# Patient Record
Sex: Female | Born: 1955
Health system: Southern US, Community
[De-identification: ages and names within clinical notes are randomized; demographics above are authoritative.]

## PROBLEM LIST (undated history)

## (undated) DIAGNOSIS — F419 Anxiety disorder, unspecified: Secondary | ICD-10-CM

## (undated) DIAGNOSIS — Z800 Family history of malignant neoplasm of digestive organs: Secondary | ICD-10-CM

## (undated) DIAGNOSIS — K5909 Other constipation: Secondary | ICD-10-CM

## (undated) DIAGNOSIS — E039 Hypothyroidism, unspecified: Secondary | ICD-10-CM

## (undated) DIAGNOSIS — K219 Gastro-esophageal reflux disease without esophagitis: Secondary | ICD-10-CM

## (undated) DIAGNOSIS — E785 Hyperlipidemia, unspecified: Secondary | ICD-10-CM

## (undated) HISTORY — DX: Family history of malignant neoplasm of digestive organs: Z80.0

## (undated) HISTORY — PX: LASIK: SHX215

## (undated) HISTORY — PX: WRIST GANGLION EXCISION: SUR520

## (undated) HISTORY — PX: OTHER SURGICAL HISTORY: SHX169

## (undated) HISTORY — PX: COLONOSCOPY: SHX174

## (undated) HISTORY — DX: Other constipation: K59.09

## (undated) HISTORY — DX: Hyperlipidemia, unspecified: E78.5

---

## 2001-09-05 ENCOUNTER — Ambulatory Visit (HOSPITAL_COMMUNITY): Admission: RE | Admit: 2001-09-05 | Discharge: 2001-09-05 | Payer: Self-pay | Admitting: Pulmonary Disease

## 2005-06-30 ENCOUNTER — Ambulatory Visit (HOSPITAL_BASED_OUTPATIENT_CLINIC_OR_DEPARTMENT_OTHER): Admission: RE | Admit: 2005-06-30 | Discharge: 2005-06-30 | Payer: Self-pay | Admitting: Orthopedic Surgery

## 2010-03-22 ENCOUNTER — Encounter: Payer: Self-pay | Admitting: Pulmonary Disease

## 2010-06-10 ENCOUNTER — Ambulatory Visit (INDEPENDENT_AMBULATORY_CARE_PROVIDER_SITE_OTHER): Payer: 59

## 2010-06-10 ENCOUNTER — Inpatient Hospital Stay (INDEPENDENT_AMBULATORY_CARE_PROVIDER_SITE_OTHER)
Admission: RE | Admit: 2010-06-10 | Discharge: 2010-06-10 | Disposition: A | Discharge status: Home / Self Care | Payer: 59 | Source: Ambulatory Visit | Attending: Emergency Medicine | Admitting: Emergency Medicine

## 2010-06-10 DIAGNOSIS — R609 Edema, unspecified: Secondary | ICD-10-CM

## 2010-06-10 LAB — POCT I-STAT, CHEM 8
BUN: 10 mg/dL (ref 6–23)
Calcium, Ion: 1.27 mmol/L (ref 1.12–1.32)
Chloride: 103 mEq/L (ref 96–112)
Creatinine, Ser: 0.6 mg/dL (ref 0.4–1.2)
HCT: 38 % (ref 36.0–46.0)
Hemoglobin: 12.9 g/dL (ref 12.0–15.0)
Potassium: 4.4 mEq/L (ref 3.5–5.1)
Sodium: 140 mEq/L (ref 135–145)
TCO2: 25 mmol/L (ref 0–100)

## 2010-06-16 LAB — HEPATIC FUNCTION PANEL
AST: 23 U/L (ref 13–35)
Bilirubin, Total: 0.5 mg/dL

## 2010-06-16 LAB — CBC AND DIFFERENTIAL: Hemoglobin: 11.8 g/dL — AB (ref 12.0–16.0)

## 2010-06-16 LAB — BASIC METABOLIC PANEL: Potassium: 4.5 mmol/L (ref 3.4–5.3)

## 2010-06-19 ENCOUNTER — Other Ambulatory Visit (HOSPITAL_COMMUNITY): Payer: Self-pay | Admitting: "Endocrinology

## 2010-06-19 DIAGNOSIS — E0590 Thyrotoxicosis, unspecified without thyrotoxic crisis or storm: Secondary | ICD-10-CM

## 2010-06-22 ENCOUNTER — Encounter (HOSPITAL_COMMUNITY): Payer: Self-pay

## 2010-06-22 ENCOUNTER — Encounter (HOSPITAL_COMMUNITY)
Admission: RE | Admit: 2010-06-22 | Discharge: 2010-06-22 | Disposition: A | Discharge status: Home / Self Care | Payer: 59 | Source: Ambulatory Visit | Attending: "Endocrinology | Admitting: "Endocrinology

## 2010-06-22 DIAGNOSIS — E0590 Thyrotoxicosis, unspecified without thyrotoxic crisis or storm: Secondary | ICD-10-CM | POA: Insufficient documentation

## 2010-06-22 MED ORDER — SODIUM IODIDE I 131 CAPSULE
10.0000 | Freq: Once | INTRAVENOUS | Status: AC | PRN
  Administered 2010-06-22: 10 via ORAL

## 2010-06-23 ENCOUNTER — Encounter (HOSPITAL_COMMUNITY): Payer: 59 | Attending: "Endocrinology

## 2010-06-23 MED ORDER — SODIUM PERTECHNETATE TC 99M INJECTION
10.0000 | Freq: Once | INTRAVENOUS | Status: AC | PRN
  Administered 2010-06-23: 10 via INTRAVENOUS

## 2010-06-25 ENCOUNTER — Other Ambulatory Visit (HOSPITAL_COMMUNITY): Payer: Self-pay | Admitting: "Endocrinology

## 2010-06-25 DIAGNOSIS — E0500 Thyrotoxicosis with diffuse goiter without thyrotoxic crisis or storm: Secondary | ICD-10-CM

## 2010-06-26 ENCOUNTER — Ambulatory Visit (HOSPITAL_COMMUNITY)
Admission: RE | Admit: 2010-06-26 | Discharge: 2010-06-26 | Disposition: A | Discharge status: Home / Self Care | Payer: 59 | Source: Ambulatory Visit | Attending: "Endocrinology | Admitting: "Endocrinology

## 2010-06-26 DIAGNOSIS — E0500 Thyrotoxicosis with diffuse goiter without thyrotoxic crisis or storm: Secondary | ICD-10-CM

## 2010-06-26 MED ORDER — SODIUM IODIDE I 131 CAPSULE
12.0000 | Freq: Once | INTRAVENOUS | Status: AC | PRN
  Administered 2010-06-26: 12 via ORAL

## 2010-10-08 ENCOUNTER — Encounter (INDEPENDENT_AMBULATORY_CARE_PROVIDER_SITE_OTHER): Payer: Self-pay

## 2010-10-19 ENCOUNTER — Ambulatory Visit (INDEPENDENT_AMBULATORY_CARE_PROVIDER_SITE_OTHER): Payer: 59 | Admitting: Internal Medicine

## 2010-11-17 ENCOUNTER — Ambulatory Visit (INDEPENDENT_AMBULATORY_CARE_PROVIDER_SITE_OTHER): Payer: 59 | Admitting: Internal Medicine

## 2011-01-18 ENCOUNTER — Encounter (INDEPENDENT_AMBULATORY_CARE_PROVIDER_SITE_OTHER): Payer: Self-pay | Admitting: Internal Medicine

## 2011-01-18 ENCOUNTER — Telehealth (INDEPENDENT_AMBULATORY_CARE_PROVIDER_SITE_OTHER): Payer: Self-pay | Admitting: *Deleted

## 2011-01-18 ENCOUNTER — Ambulatory Visit (INDEPENDENT_AMBULATORY_CARE_PROVIDER_SITE_OTHER): Payer: 59 | Admitting: Internal Medicine

## 2011-01-18 ENCOUNTER — Other Ambulatory Visit (INDEPENDENT_AMBULATORY_CARE_PROVIDER_SITE_OTHER): Payer: Self-pay | Admitting: *Deleted

## 2011-01-18 ENCOUNTER — Encounter (INDEPENDENT_AMBULATORY_CARE_PROVIDER_SITE_OTHER): Payer: Self-pay | Admitting: *Deleted

## 2011-01-18 VITALS — BP 110/74 | HR 72 | Temp 99.8°F | Resp 14 | Ht 73.0 in | Wt 190.7 lb

## 2011-01-18 DIAGNOSIS — R198 Other specified symptoms and signs involving the digestive system and abdomen: Secondary | ICD-10-CM

## 2011-01-18 DIAGNOSIS — R194 Change in bowel habit: Secondary | ICD-10-CM

## 2011-01-18 DIAGNOSIS — Z8489 Family history of other specified conditions: Secondary | ICD-10-CM

## 2011-01-18 DIAGNOSIS — Z800 Family history of malignant neoplasm of digestive organs: Secondary | ICD-10-CM

## 2011-01-18 NOTE — Consult Note (Signed)
NAME:  Elizabeth Lopez, Elizabeth Lopez              ACCOUNT NO.:  [id number]  MEDICAL RECORD NO.:  [id number]  LOCATION:                                 FACILITY:  PHYSICIAN:  Lionel December, M.D.    DATE OF BIRTH:  08/25/1955  DATE OF CONSULTATION:  01/18/2011 DATE OF DISCHARGE:                                CONSULTATION   REASON FOR CONSULTATION:  Change in bowel habits.  Family history of colon carcinoma in a first-degree relatives (mother).  HISTORY OF PRESENT ILLNESS:  Elizabeth Lopez is a 55 year old African female who is referred through courtesy of Dr.  Juanetta Gosling for GI evaluation.  She was in usual state of health until March this year when she developed pretibial edema.  She was evaluated by Dr. Juanetta Gosling and diagnosed with thyrotoxicosis.  She was treated with radioiodine under supervision of Dr. Fransico Him and now she is on Synthroid.  The patient recalls that she began to have bowel problems sometime after she received ablative therapy with radioiodine.  Her average has been 1- 2 bowel movements per day for years.  She began to pass hard stools and round balls.  She was prescribed MiraLax by Dr. Juanetta Gosling.  She took 2 doses and her bowels returned to normal.  However, for the last 2 months, she has noted getting constipated and passing fecal bowels in small pieces.  The patient's last colonoscopy was 4 years ago and because of family history, Dr. Juanetta Gosling felt that she is to be re- evaluated.  The patient states her Synthroid dose was adjusted about 2 months ago and she has an appointment with Dr. Fransico Him in a month.  She denies abdominal pain, melena or rectal bleeding.  When she was given radioiodine, she gained 8 pounds and now she is back to her baseline. She denies heartburn, nausea or vomiting.  She states she is very active.  She walks few miles 3-4 times each week.  She is on a high- fiber diet.  REVIEW OF SYSTEMS:  Positive for menstrual cramps.  She takes hydrocodone on day 1 and for the next  couple of days, she may take ibuprofen, but this is only for 2 or 3 days.  CURRENT MEDICATIONS: 1. Vitamin D 2000 units p.o. daily. 2. Hydrocodone 2.5/500 one tablet at the onset of period. 3. Ibuprofen 400 mg p.o. q.6 p.r.n. for 2-3 days each month for     menstrual cramps. 4. Levothyroxine 137 mcg p.o. daily. 5. Loestrin FE p.o. daily. 6. Vitamins for hair 1 p.o. b.i.d. 7. B12 500 mcg p.o. daily. 8. Vitamin C 500 mg p.o. daily.  PAST MEDICAL HISTORY:  She was diagnosed with thyrotoxicosis in March this year and underwent ablative therapy in April and now she is on replacement therapy.  She was diagnosed with vitamin D deficiency in April 2012.  Initially, she was treated with high-dose and now on maintenance.  She had ganglion cyst excised from her right wrist about 9 years ago.  She is felt to be perimenopausal.  The patient's last colonoscopy was in November 2008, revealing external hemorrhoids, otherwise normal examination.  ALLERGIES:  NK.  FAMILY HISTORY:  Mother was diagnosed with colorectal carcinoma at age  58 and she developed liver mets 13 years later and died at age 32. Father was diagnosed with stomach cancer at age 19 and died at 27.  She has 3 brothers and 3 sisters in good health.  She has first cousin as well as maternal aunt, would also treated for thyrotoxicosis or Graves disease.  SOCIAL HISTORY:  She is married.  She does not have any children.  She has never smoked cigarettes.  She drinks alcohol occasionally.  She works as a Pensions consultant at [employer].  PHYSICAL EXAMINATION:  VITAL SIGNS:  Weight 190.7 pounds, she is 73 inches tall, pulse 72 per minute, blood pressure 110/74, respirations 14 and temp is 99.8. HEENT:  Conjunctivae are pink.  Sclerae are nonicteric.  Oropharyngeal mucosa is normal. NECK:  No neck masses or thyromegaly noted. CARDIAC:  With regular rhythm.  Normal S1 and S2.  No murmur or gallop noted. LUNGS:  Clear to  auscultation. ABDOMEN:  Symmetrical.  Bowel sounds are normal on palpation, soft abdomen without tenderness, organomegaly, or masses. RECTAL:  Deferred as she had 1 at Dr. Juanetta Gosling office revealing guaiac negative stool. EXTREMITIES:  No peripheral edema or clubbing noted.  ASSESSMENT:  Elizabeth Lopez is a 55 year old African American female who has developed change in her bowel habits with tendency to constipation.  Her bowel habits have changed while she was diagnosed with thyrotoxicosis treated with radioiodine and now on a maintenance with levothyroxine.  I suspect these changes are due to her thyroid disease.  Family history is significant for colorectal carcinoma in her mother at age 37.  The patient's last colonoscopy was 4 years ago.  Given her symptoms, we would recommend proceeding with colonoscopy now rather than waiting for another year.  RECOMMENDATIONS: 1. The patient advised to continue high-fiber diet and can take     MiraLax on as needed basis. 2. We will schedule her for diagnostic colonoscopy in near future.  Procedure and risks are reviewed with the patient and she is agreeable.  We appreciate the opportunity to participate in the care of this nice lady.          ______________________________ Lionel December, M.D.     NR/MEDQ  D:  01/18/2011  T:  01/18/2011  Job:  [id number]  cc:   Ramon Dredge L. Juanetta Gosling, M.D. Fax: [phone]

## 2011-01-18 NOTE — Telephone Encounter (Signed)
Patient needs movi prep 

## 2011-01-18 NOTE — Patient Instructions (Signed)
Continue high-fiber diet and can take MiraLAX on an as-needed basis. Colonoscopy to be scheduled in near future.

## 2011-01-19 MED ORDER — PEG-KCL-NACL-NASULF-NA ASC-C 100 G PO SOLR: 1.0000 | Freq: Once | ORAL | Status: DC

## 2011-01-19 NOTE — Progress Notes (Signed)
CONSULTATION  REASON FOR CONSULTATION: Change in bowel habits. Family history of  colon carcinoma in a first-degree relatives (mother).  HISTORY OF PRESENT ILLNESS: Roxana is a 55 year old African female who  is referred through courtesy of Dr. Juanetta Gosling for GI evaluation. She was  in usual state of health until March this year when she developed  pretibial edema. She was evaluated by Dr. Juanetta Gosling and diagnosed with  thyrotoxicosis. She was treated with radioiodine under supervision of  Dr. Fransico Him and now she is on Synthroid.  The patient recalls that she began to have bowel problems sometime after  she received ablative therapy with radioiodine. Her average has been 1-  2 bowel movements per day for years. She began to pass hard stools and  round balls. She was prescribed MiraLax by Dr. Juanetta Gosling. She took 2  doses and her bowels returned to normal. However, for the last 2  months, she has noted getting constipated and passing fecal bowels in  small pieces. The patient's last colonoscopy was 4 years ago and  because of family history, Dr. Juanetta Gosling felt that she is to be re-  evaluated. The patient states her Synthroid dose was adjusted about 2  months ago and she has an appointment with Dr. Fransico Him in a month. She  denies abdominal pain, melena or rectal bleeding. When she was given  radioiodine, she gained 8 pounds and now she is back to her baseline.  She denies heartburn, nausea or vomiting. She states she is very  active. She walks few miles 3-4 times each week. She is on a high-  fiber diet.  REVIEW OF SYSTEMS: Positive for menstrual cramps. She takes  hydrocodone on day 1 and for the next couple of days, she may take  ibuprofen, but this is only for 2 or 3 days.  CURRENT MEDICATIONS:  1. Vitamin D 2000 units p.o. daily.  2. Hydrocodone 2.5/500 one tablet at the onset of period.  3. Ibuprofen 400 mg p.o. q.6 p.r.n. for 2-3 days each month for  menstrual cramps.  4. Levothyroxine 137 mcg  p.o. daily.  5. Loestrin FE p.o. daily.  6. Vitamins for hair 1 p.o. b.i.d.  7. B12 500 mcg p.o. daily.  8. Vitamin C 500 mg p.o. daily.  PAST MEDICAL HISTORY: She was diagnosed with thyrotoxicosis in March  this year and underwent ablative therapy in April and now she is on  replacement therapy. She was diagnosed with vitamin D deficiency in  April 2012. Initially, she was treated with high-dose and now on  maintenance. She had ganglion cyst excised from her right wrist about 9  years ago. She is felt to be perimenopausal.  The patient's last colonoscopy was in November 2008, revealing external  hemorrhoids, otherwise normal examination.  ALLERGIES: NK.  FAMILY HISTORY: Mother was diagnosed with colorectal carcinoma at age  45 and she developed liver mets 13 years later and died at age 69.  Father was diagnosed with stomach cancer at age 10 and died at 65. She  has 3 brothers and 3 sisters in good health. She has first cousin as  well as maternal aunt, would also treated for thyrotoxicosis or Graves  disease.  SOCIAL HISTORY: She is married. She does not have any children. She  has never smoked cigarettes. She drinks alcohol occasionally. She  works as a Pensions consultant at [employer].  PHYSICAL EXAMINATION: VITAL SIGNS: Weight 190.7 pounds, she is 73  inches tall, pulse 72 per minute, blood pressure 110/74,  respirations 14  and temp is 99.8.  HEENT: Conjunctivae are pink. Sclerae are nonicteric. Oropharyngeal  mucosa is normal.  NECK: No neck masses or thyromegaly noted.  CARDIAC: With regular rhythm. Normal S1 and S2. No murmur or gallop  noted.  LUNGS: Clear to auscultation.  ABDOMEN: Symmetrical. Bowel sounds are normal on palpation, soft  abdomen without tenderness, organomegaly, or masses.  RECTAL: Deferred as she had 1 at Dr. Juanetta Gosling office revealing guaiac  negative stool.  EXTREMITIES: No peripheral edema or clubbing noted.  ASSESSMENT: Tziporah is a 55 year old African  American female who has  developed change in her bowel habits with tendency to constipation. Her  bowel habits have changed while she was diagnosed with thyrotoxicosis  treated with radioiodine and now on a maintenance with levothyroxine. I  suspect these changes are due to her thyroid disease. Family history is  significant for colorectal carcinoma in her mother at age 40. The  patient's last colonoscopy was 4 years ago. Given her symptoms, I would recommend proceeding with colonoscopy now rather than waiting for  another year.  RECOMMENDATIONS:  1. The patient advised to continue high-fiber diet and can take  MiraLax on as needed basis.  2. We will schedule her for diagnostic colonoscopy in near future.  Procedure and risks  reviewed with the patient and she is agreeable.  We appreciate the opportunity to participate in the care of this nice  lady.

## 2011-02-11 MED ORDER — SODIUM CHLORIDE 0.45 % IV SOLN
Freq: Once | INTRAVENOUS | Status: AC
  Administered 2011-02-12: 20 mL/h via INTRAVENOUS

## 2011-02-12 ENCOUNTER — Encounter (HOSPITAL_COMMUNITY)
Admission: RE | Disposition: A | Discharge status: Home / Self Care | Payer: Self-pay | Source: Ambulatory Visit | Attending: Internal Medicine

## 2011-02-12 ENCOUNTER — Encounter (HOSPITAL_COMMUNITY): Payer: Self-pay | Admitting: *Deleted

## 2011-02-12 ENCOUNTER — Other Ambulatory Visit (INDEPENDENT_AMBULATORY_CARE_PROVIDER_SITE_OTHER): Payer: Self-pay | Admitting: Internal Medicine

## 2011-02-12 ENCOUNTER — Ambulatory Visit (HOSPITAL_COMMUNITY)
Admission: RE | Admit: 2011-02-12 | Discharge: 2011-02-12 | Disposition: A | Discharge status: Home / Self Care | Payer: 59 | Source: Ambulatory Visit | Attending: Internal Medicine | Admitting: Internal Medicine

## 2011-02-12 DIAGNOSIS — R194 Change in bowel habit: Secondary | ICD-10-CM

## 2011-02-12 DIAGNOSIS — Z800 Family history of malignant neoplasm of digestive organs: Secondary | ICD-10-CM | POA: Insufficient documentation

## 2011-02-12 DIAGNOSIS — K5909 Other constipation: Secondary | ICD-10-CM | POA: Insufficient documentation

## 2011-02-12 DIAGNOSIS — K449 Diaphragmatic hernia without obstruction or gangrene: Secondary | ICD-10-CM

## 2011-02-12 DIAGNOSIS — K644 Residual hemorrhoidal skin tags: Secondary | ICD-10-CM | POA: Insufficient documentation

## 2011-02-12 DIAGNOSIS — D126 Benign neoplasm of colon, unspecified: Secondary | ICD-10-CM

## 2011-02-12 DIAGNOSIS — R198 Other specified symptoms and signs involving the digestive system and abdomen: Secondary | ICD-10-CM

## 2011-02-12 HISTORY — PX: COLONOSCOPY: SHX5424

## 2011-02-12 SURGERY — COLONOSCOPY
Anesthesia: Moderate Sedation

## 2011-02-12 MED ORDER — MEPERIDINE HCL 50 MG/ML IJ SOLN
INTRAMUSCULAR | Status: AC
  Filled 2011-02-12: qty 1

## 2011-02-12 MED ORDER — MIDAZOLAM HCL 5 MG/5ML IJ SOLN
INTRAMUSCULAR | Status: AC
  Filled 2011-02-12: qty 10

## 2011-02-12 MED ORDER — MIDAZOLAM HCL 5 MG/5ML IJ SOLN
INTRAMUSCULAR | Status: DC | PRN
  Administered 2011-02-12 (×5): 2 mg via INTRAVENOUS

## 2011-02-12 MED ORDER — MEPERIDINE HCL 50 MG/ML IJ SOLN
INTRAMUSCULAR | Status: DC | PRN
  Administered 2011-02-12 (×2): 25 mg via INTRAVENOUS

## 2011-02-12 MED ORDER — STERILE WATER FOR IRRIGATION IR SOLN
Status: DC | PRN
  Administered 2011-02-12: 09:00:00

## 2011-02-12 NOTE — Op Note (Signed)
COLONOSCOPY PROCEDURE REPORT  PATIENT:  Elizabeth Lopez  MR#:  [id number] Birthdate:  01/22/1956, 55 y.o., female Endoscopist:  Dr. Malissa Hippo, MD Referred By:  Dr. Oneal Deputy. Juanetta Gosling, MD Procedure Date: 02/12/2011  Procedure:   Colonoscopy  Indications:  Patient is 55 year old African female who has noted change in her bowel habits with progressive constipation . Patient's loss colonoscopy was in November 2008. Family history is positive for colon carcinoma in her mother age 43 and she died of liver metastases 13 years later. Patient is undergoing colonoscopy primarily for diagnostic indication.  Informed Consent:  Procedure and risks were reviewed with the patient and informed consent was obtained  Medications:  Demerol 50 mg IV Versed 10 mg IV  Description of procedure:  After a digital rectal exam was performed, that colonoscope was advanced from the anus through the rectum and colon to the area of the cecum, ileocecal valve and appendiceal orifice. The cecum was deeply intubated. These structures were well-seen and photographed for the record. From the level of the cecum and ileocecal valve, the scope was slowly and cautiously withdrawn. The mucosal surfaces were carefully surveyed utilizing scope tip to flexion to facilitate fold flattening as needed. The scope was pulled down into the rectum where a thorough exam including retroflexion was performed.  Findings:   Prep excellent. Tortuous colon with two 3 mm polyps at sigmoid colon. These were ablated via cold biopsy and submitted in one container. Rectal mucosa was normal. Small hemorrhoids noted below the dentate line.  Therapeutic/Diagnostic Maneuvers Performed:  See above  Complications:  None  Cecal Withdrawal Time:  10 minutes  Impression:  Examination performed to cecum. Two small polyps ablated via cold cold biopsy from sigmoid colon and submitted in one container. Small external hemorrhoids.   Recommendations:    Standard instructions given. She will continue high-fiber diet, fiber supplements and when necessary MiraLAX. I will be contacting patient with results of biopsy. She should return for next colonoscopy in 5 years  Skyler Dusing U  02/12/2011 10:14 AM  CC: Dr. Fredirick Maudlin, MD & Dr. Bonnetta Barry ref. provider found

## 2011-02-12 NOTE — H&P (Signed)
This is an update to history and physical from 01/18/2011. Patient is not taking MiraLAX as recommended. She is agreeable to proceed with colonoscopy.

## 2011-02-26 ENCOUNTER — Encounter (INDEPENDENT_AMBULATORY_CARE_PROVIDER_SITE_OTHER): Payer: Self-pay | Admitting: *Deleted

## 2011-03-01 ENCOUNTER — Encounter (HOSPITAL_COMMUNITY): Payer: Self-pay | Admitting: Internal Medicine

## 2011-06-29 ENCOUNTER — Encounter (INDEPENDENT_AMBULATORY_CARE_PROVIDER_SITE_OTHER): Payer: Self-pay

## 2013-09-13 ENCOUNTER — Encounter (INDEPENDENT_AMBULATORY_CARE_PROVIDER_SITE_OTHER): Payer: Self-pay | Admitting: *Deleted

## 2013-09-20 ENCOUNTER — Other Ambulatory Visit (INDEPENDENT_AMBULATORY_CARE_PROVIDER_SITE_OTHER): Payer: Self-pay | Admitting: *Deleted

## 2013-09-20 ENCOUNTER — Encounter (INDEPENDENT_AMBULATORY_CARE_PROVIDER_SITE_OTHER): Payer: Self-pay | Admitting: *Deleted

## 2013-09-20 ENCOUNTER — Ambulatory Visit (INDEPENDENT_AMBULATORY_CARE_PROVIDER_SITE_OTHER): Payer: 59 | Admitting: Internal Medicine

## 2013-09-20 ENCOUNTER — Encounter (INDEPENDENT_AMBULATORY_CARE_PROVIDER_SITE_OTHER): Payer: Self-pay | Admitting: Internal Medicine

## 2013-09-20 VITALS — BP 108/70 | HR 76 | Temp 98.1°F | Ht 72.0 in | Wt 189.1 lb

## 2013-09-20 DIAGNOSIS — E039 Hypothyroidism, unspecified: Secondary | ICD-10-CM

## 2013-09-20 DIAGNOSIS — K219 Gastro-esophageal reflux disease without esophagitis: Secondary | ICD-10-CM

## 2013-09-20 DIAGNOSIS — E890 Postprocedural hypothyroidism: Secondary | ICD-10-CM | POA: Insufficient documentation

## 2013-09-20 DIAGNOSIS — E7800 Pure hypercholesterolemia, unspecified: Secondary | ICD-10-CM

## 2013-09-20 MED ORDER — OMEPRAZOLE 40 MG PO CPDR: 40.0000 mg | DELAYED_RELEASE_CAPSULE | Freq: Every day | ORAL | Status: DC

## 2013-09-20 NOTE — Patient Instructions (Signed)
Fleets enema (soap suds). Hold for 30-60 minutes.  May repeat x 1 . EGD.The risks and benefits such as perforation, bleeding, and infection were reviewed with the patient and is agreeable.

## 2013-09-20 NOTE — Progress Notes (Signed)
Subjective:     Patient ID: Elizabeth Lopez, female   DOB: [birth year], 58 y.o.   MRN: [id number]  HPI  Saw Dr. Dorris Fetch the first of the year.  When she ate she felt like her food was coming back up in her esophagus.  She was started on Omeprazole and this helped her symptoms. She was given a 2 months supply. She tells me today that since she has been off the Omperazole. she has  acid reflux and food feels like it is coming back up in her esophagus. She tells me she has a burning in her stomach. She says she felt bad and she really did not have an appetite. A friend gave her Nexium OTC and her symptoms improved. She tells me the Nexium caused her to become constipated. She has not had a BM since Sunday.  She took a laxative yesterday. She has drank water, ate turnip greens yesterday without results. Family hx of stomach cancer (Fatjher had stomach cancer age 66) She usually has a BM daily. Reports no melena or BRRB. Appetite has remaind good.  Appetite is good. There has been no weight loss.  02/12/2011 Colonoscopy: Family hx of colon cancer:  Impression:  Examination performed to cecum.  Two small polyps ablated via cold cold biopsy from sigmoid colon and submitted in one container.  Small external hemorrhoids.  Biopsy: Hyperplastic polyp    Review of Systems Past Medical History  Diagnosis Date  . Chronic constipation   . Hyperthyroidism   . Hyperlipidemia   . Family hx of colon cancer     Past Surgical History  Procedure Laterality Date  . Knot      patient had a knot removed from right wrisit 9 years ago.  . Colonoscopy    . Lasik    . Wrist ganglion excision      right  . Colonoscopy  02/12/2011    Procedure: COLONOSCOPY;  Surgeon: Rogene Houston, MD;  Location: [facility];  Service: Endoscopy;  Laterality: N/A;  9:45    No Known Allergies  Current Outpatient Prescriptions on File Prior to Visit  Medication Sig Dispense Refill  . Cholecalciferol (VITAMIN D PO) Take  1,000 mg by mouth daily.        Marland Kitchen ibuprofen (ADVIL,MOTRIN) 200 MG tablet Take 400 mg by mouth every 6 (six) hours as needed. For menstrual cramps; no more 2 to 3 days each month.       . levothyroxine (SYNTHROID, LEVOTHROID) 137 MCG tablet Take 175 mcg by mouth daily.       Cyndie Chime Estrad-Fe Biphas (LO LOESTRIN FE PO) Take by mouth.        . vitamin B-12 (CYANOCOBALAMIN) 500 MCG tablet Take 500 mcg by mouth daily.        . vitamin C (ASCORBIC ACID) 500 MG tablet Take 500 mg by mouth daily.         No current facility-administered medications on file prior to visit.        Objective:   Physical Exam  Filed Vitals:   09/20/13 1547  BP: 108/70  Pulse: 76  Temp: 98.1 F (36.7 C)  Height: 6' (1.829 m)  Weight: 189 lb 1.6 oz (85.775 kg)    Alert and oriented. Skin warm and dry. Oral mucosa is moist.   . Sclera anicteric, conjunctivae is pink. Thyroid not enlarged. No cervical lymphadenopathy. Lungs clear. Heart regular rate and rhythm.  Abdomen is soft. Bowel sounds are positive. No  hepatomegaly. No abdominal masses felt. No tenderness.  No edema to lower extremities.   Rectal exam did not reveal an impaction.     Assessment:    GERD. Not controlled at this time. Will restart her on Omeprazole. PUD needs to be ruled out.   Constipation. Symptoms started after taking Nexium which she has stopped. Plan:    EGD.The risks and benefits such as perforation, bleeding, and infection were reviewed with the patient and is agreeable.   Fleets enema today. Try to hold for at least 30 minutes. If no results: repeat. Call me tomorrow.

## 2013-09-28 ENCOUNTER — Encounter (HOSPITAL_COMMUNITY): Payer: Self-pay | Admitting: Pharmacy Technician

## 2013-10-05 ENCOUNTER — Encounter (HOSPITAL_COMMUNITY): Payer: Self-pay | Admitting: *Deleted

## 2013-10-05 ENCOUNTER — Encounter (HOSPITAL_COMMUNITY)
Admission: RE | Disposition: A | Discharge status: Home / Self Care | Payer: Self-pay | Source: Ambulatory Visit | Attending: Internal Medicine

## 2013-10-05 ENCOUNTER — Ambulatory Visit (HOSPITAL_COMMUNITY)
Admission: RE | Admit: 2013-10-05 | Discharge: 2013-10-05 | Disposition: A | Discharge status: Home / Self Care | Payer: 59 | Source: Ambulatory Visit | Attending: Internal Medicine | Admitting: Internal Medicine

## 2013-10-05 DIAGNOSIS — Z800 Family history of malignant neoplasm of digestive organs: Secondary | ICD-10-CM | POA: Diagnosis not present

## 2013-10-05 DIAGNOSIS — E0590 Thyrotoxicosis, unspecified without thyrotoxic crisis or storm: Secondary | ICD-10-CM | POA: Diagnosis not present

## 2013-10-05 DIAGNOSIS — K228 Other specified diseases of esophagus: Secondary | ICD-10-CM

## 2013-10-05 DIAGNOSIS — Z79899 Other long term (current) drug therapy: Secondary | ICD-10-CM | POA: Insufficient documentation

## 2013-10-05 DIAGNOSIS — K2289 Other specified disease of esophagus: Secondary | ICD-10-CM

## 2013-10-05 DIAGNOSIS — R1013 Epigastric pain: Secondary | ICD-10-CM | POA: Diagnosis present

## 2013-10-05 DIAGNOSIS — K219 Gastro-esophageal reflux disease without esophagitis: Secondary | ICD-10-CM

## 2013-10-05 DIAGNOSIS — K449 Diaphragmatic hernia without obstruction or gangrene: Secondary | ICD-10-CM | POA: Insufficient documentation

## 2013-10-05 DIAGNOSIS — K5900 Constipation, unspecified: Secondary | ICD-10-CM | POA: Insufficient documentation

## 2013-10-05 DIAGNOSIS — E785 Hyperlipidemia, unspecified: Secondary | ICD-10-CM | POA: Insufficient documentation

## 2013-10-05 HISTORY — PX: ESOPHAGOGASTRODUODENOSCOPY: SHX5428

## 2013-10-05 SURGERY — EGD (ESOPHAGOGASTRODUODENOSCOPY)
Anesthesia: Moderate Sedation

## 2013-10-05 MED ORDER — STERILE WATER FOR IRRIGATION IR SOLN
Status: DC | PRN
  Administered 2013-10-05: 09:00:00

## 2013-10-05 MED ORDER — MEPERIDINE HCL 50 MG/ML IJ SOLN
INTRAMUSCULAR | Status: DC | PRN
  Administered 2013-10-05 (×2): 25 mg via INTRAVENOUS

## 2013-10-05 MED ORDER — MIDAZOLAM HCL 5 MG/5ML IJ SOLN
INTRAMUSCULAR | Status: AC
  Filled 2013-10-05: qty 10

## 2013-10-05 MED ORDER — BUTAMBEN-TETRACAINE-BENZOCAINE 2-2-14 % EX AERO
INHALATION_SPRAY | CUTANEOUS | Status: DC | PRN
  Administered 2013-10-05: 2 via TOPICAL

## 2013-10-05 MED ORDER — MEPERIDINE HCL 50 MG/ML IJ SOLN
INTRAMUSCULAR | Status: AC
  Filled 2013-10-05: qty 1

## 2013-10-05 MED ORDER — MIDAZOLAM HCL 5 MG/5ML IJ SOLN
INTRAMUSCULAR | Status: DC | PRN
  Administered 2013-10-05: 1 mg via INTRAVENOUS
  Administered 2013-10-05 (×2): 2 mg via INTRAVENOUS

## 2013-10-05 NOTE — H&P (Signed)
Elizabeth Lopez is an 58 y.o. female.   Chief Complaint: Patient is here for EGD. HPI: Patient is a 58 year old African female who began to experience burning in the epigastric region and regurgitation about 7 months ago. She took OTC PPI 2 months and symptoms resolved. Symptoms relapse off PPI. She was seen by Dr. Luan Pulling and referred for EGD. She was begun on PPI 2 weeks ago and feels some better. She still has epigastric burning. She denies nausea vomiting dysphagia or melena. Family history significant for gastric carcinoma in father who is 13 at the time of diagnosis.  Past Medical History  Diagnosis Date  . Chronic constipation   . Hyperthyroidism   . Hyperlipidemia   . Family hx of colon cancer     Past Surgical History  Procedure Laterality Date  . Knot      patient had a knot removed from right wrisit 9 years ago.  . Colonoscopy    . Lasik    . Wrist ganglion excision      right  . Colonoscopy  02/12/2011    Procedure: COLONOSCOPY;  Surgeon: Rogene Houston, MD;  Location: [facility];  Service: Endoscopy;  Laterality: N/A;  9:45    Family History  Problem Relation Age of Onset  . Colon cancer Mother   . Cancer Mother   . Healthy Sister   . Healthy Brother   . Healthy Sister   . Healthy Sister   . Healthy Brother   . Healthy Brother   . Cancer Father    Social History:  reports that she has never smoked. She has never used smokeless tobacco. She reports that she drinks alcohol. She reports that she does not use illicit drugs.  Allergies: No Known Allergies  Medications Prior to Admission  Medication Sig Dispense Refill  . Cholecalciferol (VITAMIN D PO) Take 1,000 mg by mouth daily.        Marland Kitchen ibuprofen (ADVIL,MOTRIN) 200 MG tablet Take 400 mg by mouth every 6 (six) hours as needed. For menstrual cramps; no more 2 to 3 days each month.       . levothyroxine (SYNTHROID, LEVOTHROID) 137 MCG tablet Take 175 mcg by mouth daily.       Cyndie Chime Estrad-Fe Biphas  (LO LOESTRIN FE PO) Take by mouth.        Marland Kitchen omeprazole (PRILOSEC) 40 MG capsule Take 1 capsule (40 mg total) by mouth daily.  30 capsule  3  . vitamin B-12 (CYANOCOBALAMIN) 500 MCG tablet Take 500 mcg by mouth daily.        . vitamin C (ASCORBIC ACID) 500 MG tablet Take 500 mg by mouth daily.          No results found for this or any previous visit (from the past 48 hour(s)). No results found.  ROS  Blood pressure 110/78, pulse 69, temperature 97.8 F (36.6 C), temperature source Oral, resp. rate 15, SpO2 100.00%. Physical Exam  Constitutional: She appears well-developed and well-nourished.  HENT:  Mouth/Throat: Oropharynx is clear and moist.  Eyes: Conjunctivae are normal. No scleral icterus.  Neck: No thyromegaly present.  Cardiovascular: Normal rate, regular rhythm and normal heart sounds.   No murmur heard. Respiratory: Effort normal and breath sounds normal.  GI: Soft. She exhibits no distension and no mass. There is tenderness (mild midepigastric tenderness).  Musculoskeletal: She exhibits no edema.  Lymphadenopathy:    She has no cervical adenopathy.  Neurological: She is alert.  Skin: Skin is  warm and dry.     Assessment/Plan Recent onset of heartburn and epigastric pain in a 58 year old. Diagnostic EGD.  REHMAN,NAJEEB U 10/05/2013, 8:44 AM

## 2013-10-05 NOTE — Op Note (Signed)
EGD PROCEDURE REPORT  PATIENT:  Elizabeth Lopez  MR#:  [id number] Birthdate:  07-Jan-1956, 58 y.o., female Endoscopist:  Dr. Rogene Houston, MD Referred By:  Dr. Jasper Loser. Luan Pulling, MD  Procedure Date: 10/05/2013  Procedure:   EGD  Indications:  Patient is  58 year old African female who presents with 7 month history of heartburn and intermittent epigastric pain. Family history significant for gastric carcinoma in her father was 92 at the time of diagnosis.            Informed Consent:  The risks, benefits, alternatives & imponderables which include, but are not limited to, bleeding, infection, perforation, drug reaction and potential missed lesion have been reviewed.  The potential for biopsy, lesion removal, esophageal dilation, etc. have also been discussed.  Questions have been answered.  All parties agreeable.  Please see history & physical in medical record for more information.  Medications:  Demerol 50 mg IV Versed 5 mg IV Cetacaine spray topically for oropharyngeal anesthesia  Description of procedure:  The endoscope was introduced through the mouth and advanced to the second portion of the duodenum without difficulty or limitations. The mucosal surfaces were surveyed very carefully during advancement of the scope and upon withdrawal.  Findings:  Esophagus:   Fine granularity noted to mucosa of the esophagus. No circumferential rings or linear furrows noted. GEJ:   39 cm Hiatus:   41 cm Stomach:   stomach was empty and distended very well with insufflation. Folds in the proximal stomach were normal. Examination of mucosa at body, antrum, pyloric channel, angularis, fundus and cardia was normal  Duodenum:   normal bulbar and post bulbar mucosa   Therapeutic/Diagnostic Maneuvers Performed:   biopsy taken from mucosa of the esophagus for routine histology.  Complications:   none  Impression: Abnormal appearance to mucosa of the esophagus with fine granularity. Biopsy taken.  Small  sliding hiatal hernia without changes of erosive esophagitis. No evidence of peptic ulcer disease.  Recommendations:  Continue anti-reflux measures and omeprazole at 40 mg by mouth every morning. I will be contacting patient see results and further recommendations.   REHMAN,NAJEEB U  10/05/2013  9:06 AM  CC: Dr. Alonza Bogus, MD & Dr. Rayne Du ref. provider found

## 2013-10-05 NOTE — Discharge Instructions (Signed)
Resume usual medications and diet. Remember to take omeprazole by mouth 30 minutes before breakfast daily. No driving for 24 hours. Physician will call with biopsy results.  Gastrointestinal Endoscopy, Care After Refer to this sheet in the next few weeks. These instructions provide you with information on caring for yourself after your procedure. Your caregiver may also give you more specific instructions. Your treatment has been planned according to current medical practices, but problems sometimes occur. Call your caregiver if you have any problems or questions after your procedure. HOME CARE INSTRUCTIONS  If you were given medicine to help you relax (sedative), do not drive, operate machinery, or sign important documents for 24 hours.  Avoid alcohol and hot or warm beverages for the first 24 hours after the procedure.  Only take over-the-counter or prescription medicines for pain, discomfort, or fever as directed by your caregiver. You may resume taking your normal medicines unless your caregiver tells you otherwise. Ask your caregiver when you may resume taking medicines that may cause bleeding, such as aspirin, clopidogrel, or warfarin.  You may return to your normal diet and activities on the day after your procedure, or as directed by your caregiver. Walking may help to reduce any bloated feeling in your abdomen.  Drink enough fluids to keep your urine clear or pale yellow.  You may gargle with salt water if you have a sore throat. SEEK IMMEDIATE MEDICAL CARE IF:  You have severe nausea or vomiting.  You have severe abdominal pain, abdominal cramps that last longer than 6 hours, or abdominal swelling (distention).  You have severe shoulder or back pain.  You have trouble swallowing.  You have shortness of breath, your breathing is shallow, or you are breathing faster than normal.  You have a fever or a rapid heartbeat.  You vomit blood or material that looks like coffee  grounds.  You have bloody, black, or tarry stools. MAKE SURE YOU:  Understand these instructions.  Will watch your condition.  Will get help right away if you are not doing well or get worse. Document Released: 09/30/2003 Document Revised: 07/02/2013 Document Reviewed: 05/18/2011 [facility] Patient Information 2015 [place], [place]. This information is not intended to replace advice given to you by your health care provider. Make sure you discuss any questions you have with your health care provider.

## 2013-10-10 ENCOUNTER — Encounter (HOSPITAL_COMMUNITY): Payer: Self-pay | Admitting: Internal Medicine

## 2013-10-12 ENCOUNTER — Telehealth (INDEPENDENT_AMBULATORY_CARE_PROVIDER_SITE_OTHER): Payer: Self-pay | Admitting: *Deleted

## 2013-10-12 NOTE — Telephone Encounter (Signed)
Diagnosis Esophagus, biopsy - BENIGN SQUAMOUS MUCOSA. - THERE IS NO EVIDENCE OF INCREASED EOSINOPHILS, GOBLET CELL METAPLASIA, DYSPLASIA OR MALIGNANCY. - SEE COMMENT. Microscopic Comment Gastroesophageal junctional mucosa is not identified. (JBK:kh 10-08-13) Enid Cutter MD Pathologist, Electronic Signature (Case signed 10/08/2013) Specimen Gross and Clinical Information Specimen(s) Obtained: Esophagus, biopsy Specimen Clinical Information evaluate for abnormalities; Pre-op: GERD; Post-op: hiatal hernia, hiatus 41/39 cm, esophageal abnormal appearing mucosa Gross Received in formalin are tan, soft tissue fragments that are submitted in toto. Number: 4, Size: 0.2 to 0.6 cm. One block. (SW:ds 10/05/13) Report signed out from the following location(s) Technical Component and Interpretation performed at [email or web address], [place], [place] [id number]. CLIA #: [id number], 1 of Patient was called and given results.

## 2013-10-12 NOTE — Telephone Encounter (Signed)
Elizabeth Lopez received a message from Dr. Laural Golden and she is not sure of what he said. Would like to see if the nurse would return her call at [phone].

## 2013-11-06 ENCOUNTER — Encounter (INDEPENDENT_AMBULATORY_CARE_PROVIDER_SITE_OTHER): Payer: Self-pay | Admitting: *Deleted

## 2013-11-29 ENCOUNTER — Encounter (INDEPENDENT_AMBULATORY_CARE_PROVIDER_SITE_OTHER): Payer: Self-pay | Admitting: *Deleted

## 2014-01-21 ENCOUNTER — Ambulatory Visit (INDEPENDENT_AMBULATORY_CARE_PROVIDER_SITE_OTHER): Payer: 59 | Admitting: Internal Medicine

## 2014-02-05 ENCOUNTER — Ambulatory Visit (INDEPENDENT_AMBULATORY_CARE_PROVIDER_SITE_OTHER): Payer: 59 | Admitting: Internal Medicine

## 2014-03-14 ENCOUNTER — Other Ambulatory Visit (INDEPENDENT_AMBULATORY_CARE_PROVIDER_SITE_OTHER): Payer: Self-pay | Admitting: Internal Medicine

## 2015-01-16 ENCOUNTER — Other Ambulatory Visit (HOSPITAL_COMMUNITY): Payer: Self-pay | Admitting: Pulmonary Disease

## 2015-01-16 DIAGNOSIS — Z1231 Encounter for screening mammogram for malignant neoplasm of breast: Secondary | ICD-10-CM

## 2015-01-22 ENCOUNTER — Other Ambulatory Visit: Payer: Self-pay | Admitting: "Endocrinology

## 2015-02-17 ENCOUNTER — Ambulatory Visit (HOSPITAL_COMMUNITY)
Admission: RE | Admit: 2015-02-17 | Discharge: 2015-02-17 | Disposition: A | Discharge status: Home / Self Care | Payer: 59 | Source: Ambulatory Visit | Attending: Pulmonary Disease | Admitting: Pulmonary Disease

## 2015-02-17 ENCOUNTER — Encounter: Payer: Self-pay | Admitting: "Endocrinology

## 2015-02-17 ENCOUNTER — Ambulatory Visit (INDEPENDENT_AMBULATORY_CARE_PROVIDER_SITE_OTHER): Payer: 59 | Admitting: "Endocrinology

## 2015-02-17 VITALS — BP 152/96 | HR 95 | Ht 76.0 in | Wt 185.0 lb

## 2015-02-17 DIAGNOSIS — E032 Hypothyroidism due to medicaments and other exogenous substances: Secondary | ICD-10-CM | POA: Diagnosis not present

## 2015-02-17 DIAGNOSIS — Z1231 Encounter for screening mammogram for malignant neoplasm of breast: Secondary | ICD-10-CM | POA: Diagnosis not present

## 2015-02-17 MED ORDER — LEVOTHYROXINE SODIUM 175 MCG PO TABS: 175.0000 ug | ORAL_TABLET | Freq: Every day | ORAL | Status: DC

## 2015-02-17 NOTE — Progress Notes (Signed)
Subjective:    Patient ID: Elizabeth Lopez, female    DOB: [birth year], PCP Alonza Bogus, MD   Past Medical History  Diagnosis Date  . Chronic constipation   . Hyperthyroidism   . Hyperlipidemia   . Family hx of colon cancer    Past Surgical History  Procedure Laterality Date  . Knot      patient had a knot removed from right wrisit 9 years ago.  . Colonoscopy    . Lasik    . Wrist ganglion excision      right  . Colonoscopy  02/12/2011    Procedure: COLONOSCOPY;  Surgeon: Rogene Houston, MD;  [facility];  Service: Endoscopy;  Laterality: N/A;  9:45  . Esophagogastroduodenoscopy N/A 10/05/2013    Procedure: ESOPHAGOGASTRODUODENOSCOPY (EGD);  Surgeon: Rogene Houston, MD;  Location: [facility];  Service: Endoscopy;  Laterality: N/A;  1200-rescheduled to [place] notified pt   Social History   Social History  . Marital Status: Married    Spouse Name: N/A  . Number of Children: N/A  . Years of Education: N/A   Social History Main Topics  . Smoking status: Never Smoker   . Smokeless tobacco: Never Used  . Alcohol Use: Yes     Comment: occasional wine  . Drug Use: No  . Sexual Activity: Not Asked   Other Topics Concern  . None   Social History Narrative   Outpatient Encounter Prescriptions as of 02/17/2015  Medication Sig  . Cholecalciferol (VITAMIN D PO) Take 1,000 mg by mouth daily.    . vitamin B-12 (CYANOCOBALAMIN) 500 MCG tablet Take 500 mcg by mouth daily.    . vitamin C (ASCORBIC ACID) 500 MG tablet Take 500 mg by mouth daily.    Marland Kitchen levothyroxine (SYNTHROID, LEVOTHROID) 175 MCG tablet Take 1 tablet (175 mcg total) by mouth daily before breakfast.  . [DISCONTINUED] ibuprofen (ADVIL,MOTRIN) 200 MG tablet Take 400 mg by mouth every 6 (six) hours as needed. For menstrual cramps; no more 2 to 3 days each month.   . [DISCONTINUED] levothyroxine (SYNTHROID, LEVOTHROID) 137 MCG tablet Take 175 mcg by mouth daily.   . [DISCONTINUED] Norethin-Eth  Estrad-Fe Biphas (LO LOESTRIN FE PO) Take by mouth.    . [DISCONTINUED] omeprazole (PRILOSEC) 40 MG capsule TAKE ONE CAPSULE BY MOUTH DAILY   No facility-administered encounter medications on file as of 02/17/2015.   ALLERGIES: No Known Allergies VACCINATION STATUS:  There is no immunization history on file for this patient.  HPI  59 yo female with with Graves disease s/p I131 therapy on Aril 2012. She is on Synthroid 175 mcg po qam.  She did not keep her appointment for more than a year. She came with out recent thyroid function test. Her last lab work was in July 2016. She said she ran out of her thyroid hormone last 3 days.  she denies cold intolerance, heat intolerance, and palpitation.  tragically , she did not take her Crestor and states that her lipid panel is being followed by Dr. Luan Pulling.   Review of Systems  Constitutional: no weight gain/loss, no fatigue, no subjective hyperthermia/hypothermia Eyes: no blurry vision, no xerophthalmia ENT: no sore throat, no nodules palpated in throat, no dysphagia/odynophagia, no hoarseness Cardiovascular: no CP/SOB/palpitations/leg swelling Respiratory: no cough/SOB Gastrointestinal: no N/V/D/C Musculoskeletal: no muscle/joint aches Skin: no rashes Neurological: no tremors/numbness/tingling/dizziness Psychiatric: no depression/anxiety   Objective:    BP 152/96 mmHg  Pulse 95  Ht 6\' 4"  (  1.93 m)  Wt 185 lb (83.915 kg)  BMI 22.53 kg/m2  SpO2 99%  LMP 01/18/2015  Wt Readings from Last 3 Encounters:  02/17/15 185 lb (83.915 kg)  09/20/13 189 lb 1.6 oz (85.775 kg)  02/12/11 183 lb (83.008 kg)    Physical Exam  Constitutional: Noncompliant, defensive, argumentative, in NAD Eyes: PERRLA, EOMI, no exophthalmos ENT: moist mucous membranes, no thyromegaly, no cervical lymphadenopathy Skin: moist, warm, no rashes Neurological: no tremor with outstretched hands, DTR normal in all 4  Results for orders placed or performed in visit  on 10/08/10  CBC and differential  Result Value Ref Range   Hemoglobin 11.8 (A) 12.0 - 16.0 g/dL   HCT 37 36 - 46 %   Platelets 279 150 - 399 K/L  Basic metabolic panel  Result Value Ref Range   Glucose 86 mg/dL   Potassium 4.5 3.4 - 5.3 mmol/L   Sodium 138 137 - 147 mmol/L  Hepatic function panel  Result Value Ref Range   ALT 21 7 - 35 U/L   AST 23 13 - 35 U/L   Bilirubin, Total 0.5 mg/dL   Complete Blood Count (Most recent): Lab Results  Component Value Date   HGB 11.8* 06/16/2010   HCT 37 06/16/2010   PLT 279 06/16/2010   Chemistry (most recent): Lab Results  Component Value Date   NA 138 06/16/2010   K 4.5 06/16/2010   CL 103 06/10/2010   BUN 10 06/10/2010   CREATININE 0.6 06/10/2010      Assessment & Plan:   1. Hypothyroidism due to medicaments and other exogenous substances She did not have TFTs since in July. She is clinically euthyroid. She did not comply with thyroid function test before this visit. I will  continue on Synthroid 175 g by mouth every morning.  - We discussed about correct intake of levothyroxine, at fasting, with water, separated by at least 30 minutes from breakfast, and separated by more than 4 hours from calcium, iron, multivitamins, acid reflux medications (PPIs). -Patient is made aware of the fact that thyroid hormone replacement is needed for life, dose to be adjusted by periodic monitoring of thyroid function tests. I urged her to get thyroid function test before her next visit in 6 months.   - I advised patient to maintain close follow up with HAWKINS,EDWARD L, MD for primary care needs. Follow up plan: Return in about 6 months (around 08/18/2015) for underactive thyroid, follow up with pre-visit labs.  Glade Lloyd, MD Phone: [phone]  Fax: [phone]   02/17/2015, 9:50 PM

## 2015-04-05 ENCOUNTER — Other Ambulatory Visit (INDEPENDENT_AMBULATORY_CARE_PROVIDER_SITE_OTHER): Payer: Self-pay | Admitting: Internal Medicine

## 2015-05-22 ENCOUNTER — Other Ambulatory Visit (HOSPITAL_COMMUNITY): Payer: Self-pay | Admitting: Pulmonary Disease

## 2015-05-22 DIAGNOSIS — Z780 Asymptomatic menopausal state: Secondary | ICD-10-CM

## 2015-05-29 ENCOUNTER — Ambulatory Visit (HOSPITAL_COMMUNITY)
Admission: RE | Admit: 2015-05-29 | Discharge: 2015-05-29 | Disposition: A | Discharge status: Home / Self Care | Payer: 59 | Source: Ambulatory Visit | Attending: Pulmonary Disease | Admitting: Pulmonary Disease

## 2015-05-29 DIAGNOSIS — Z780 Asymptomatic menopausal state: Secondary | ICD-10-CM | POA: Diagnosis not present

## 2015-05-29 DIAGNOSIS — M8580 Other specified disorders of bone density and structure, unspecified site: Secondary | ICD-10-CM | POA: Diagnosis not present

## 2015-07-31 ENCOUNTER — Other Ambulatory Visit: Payer: Self-pay | Admitting: "Endocrinology

## 2015-07-31 LAB — T4, FREE: FREE T4: 1.6 ng/dL (ref 0.8–1.8)

## 2015-07-31 LAB — TSH: TSH: 0.26 mIU/L — ABNORMAL LOW

## 2015-08-14 ENCOUNTER — Encounter: Payer: Self-pay | Admitting: "Endocrinology

## 2015-08-14 ENCOUNTER — Ambulatory Visit (INDEPENDENT_AMBULATORY_CARE_PROVIDER_SITE_OTHER): Payer: 59 | Admitting: "Endocrinology

## 2015-08-14 VITALS — BP 135/80 | HR 74 | Ht 72.0 in | Wt 191.0 lb

## 2015-08-14 DIAGNOSIS — E890 Postprocedural hypothyroidism: Secondary | ICD-10-CM

## 2015-08-14 MED ORDER — LEVOTHYROXINE SODIUM 175 MCG PO TABS: 175.0000 ug | ORAL_TABLET | Freq: Every day | ORAL | Status: DC

## 2015-08-14 NOTE — Progress Notes (Signed)
Subjective:    Patient ID: Elizabeth Lopez, female    DOB: 01/04/1956, PCP Alonza Bogus, MD   Past Medical History  Diagnosis Date  . Chronic constipation   . Hyperthyroidism   . Hyperlipidemia   . Family hx of colon cancer    Past Surgical History  Procedure Laterality Date  . Knot      patient had a knot removed from right wrisit 9 years ago.  . Colonoscopy    . Lasik    . Wrist ganglion excision      right  . Colonoscopy  02/12/2011    Procedure: COLONOSCOPY;  Surgeon: Rogene Houston, MD;  [facility];  Service: Endoscopy;  Laterality: N/A;  9:45  . Esophagogastroduodenoscopy N/A 10/05/2013    Procedure: ESOPHAGOGASTRODUODENOSCOPY (EGD);  Surgeon: Rogene Houston, MD;  [facility];  Service: Endoscopy;  Laterality: N/A;  1200-rescheduled to [place] notified pt   Social History   Social History  . Marital Status: Married    Spouse Name: N/A  . Number of Children: N/A  . Years of Education: N/A   Social History Main Topics  . Smoking status: Never Smoker   . Smokeless tobacco: Never Used  . Alcohol Use: Yes     Comment: occasional wine  . Drug Use: No  . Sexual Activity: Not Asked   Other Topics Concern  . None   Social History Narrative   Outpatient Encounter Prescriptions as of 08/14/2015  Medication Sig  . Cholecalciferol (VITAMIN D PO) Take 1,000 mg by mouth daily.    Marland Kitchen levothyroxine (SYNTHROID, LEVOTHROID) 175 MCG tablet Take 1 tablet (175 mcg total) by mouth daily before breakfast.  . omeprazole (PRILOSEC) 40 MG capsule TAKE ONE CAPSULE BY MOUTH DAILY  . vitamin B-12 (CYANOCOBALAMIN) 500 MCG tablet Take 500 mcg by mouth daily.    . vitamin C (ASCORBIC ACID) 500 MG tablet Take 500 mg by mouth daily.    . [DISCONTINUED] levothyroxine (SYNTHROID, LEVOTHROID) 175 MCG tablet Take 1 tablet (175 mcg total) by mouth daily before breakfast.   No facility-administered encounter medications on file as of 08/14/2015.   ALLERGIES: No  Known Allergies VACCINATION STATUS:  There is no immunization history on file for this patient.  HPI  60 yo female with with Graves disease s/p I131 therapy on Aril 2012. She is on Synthroid 175 mcg po qam.   She came with  thyroid function test. she denies cold intolerance, heat intolerance, and palpitation.  tragically , she did not take her Crestor and states that her lipid panel is being followed by Dr. Luan Pulling.   Review of Systems  Constitutional: + weight gain, no fatigue, no subjective hyperthermia/hypothermia Eyes: no blurry vision, no xerophthalmia ENT: no sore throat, no nodules palpated in throat, no dysphagia/odynophagia, no hoarseness Cardiovascular: no CP/SOB/palpitations/leg swelling Respiratory: no cough/SOB Gastrointestinal: no N/V/D/C Musculoskeletal: no muscle/joint aches Skin: no rashes Neurological: no tremors/numbness/tingling/dizziness Psychiatric: no depression/anxiety   Objective:    BP 135/80 mmHg  Pulse 74  Ht 6' (1.829 m)  Wt 191 lb (86.637 kg)  BMI 25.90 kg/m2  SpO2 97%  Wt Readings from Last 3 Encounters:  08/14/15 191 lb (86.637 kg)  02/17/15 185 lb (83.915 kg)  09/20/13 189 lb 1.6 oz (85.775 kg)    Physical Exam  Constitutional:  in NAD Eyes: PERRLA, EOMI, no exophthalmos ENT: moist mucous membranes, no thyromegaly, no cervical lymphadenopathy Skin: moist, warm, no rashes Neurological: no tremor with outstretched  hands, DTR normal in all 4  Results for orders placed or performed in visit on 07/31/15  TSH  Result Value Ref Range   TSH 0.26 (L) mIU/L  T4, free  Result Value Ref Range   Free T4 1.6 0.8 - 1.8 ng/dL   Complete Blood Count (Most recent): Lab Results  Component Value Date   HGB 11.8* 06/16/2010   HCT 37 06/16/2010   PLT 279 06/16/2010   Chemistry (most recent): Lab Results  Component Value Date   NA 138 06/16/2010   K 4.5 06/16/2010   CL 103 06/10/2010   BUN 10 06/10/2010   CREATININE 0.6 06/10/2010       Assessment & Plan:    Hypothyroidism due to RAI   - Heart thyroid function tests are consistent with appropriate replacement with thyroid hormone. I have advised her to continue levothyroxine 175 g by mouth every morning.   - We discussed about correct intake of levothyroxine, at fasting, with water, separated by at least 30 minutes from breakfast, and separated by more than 4 hours from calcium, iron, multivitamins, acid reflux medications (PPIs). -Patient is made aware of the fact that thyroid hormone replacement is needed for life, dose to be adjusted by periodic monitoring of thyroid function tests. I urged her to get thyroid function test before her next visit in 6 months.  - I advised patient to maintain close follow up with HAWKINS,EDWARD L, MD for primary care needs. Follow up plan: Return in about 1 year (around 08/13/2016) for follow up with pre-visit labs.  Glade Lloyd, MD Phone: [phone]  Fax: [phone]   08/14/2015, 3:40 PM

## 2015-08-21 ENCOUNTER — Ambulatory Visit: Payer: 59 | Admitting: "Endocrinology

## 2015-10-09 ENCOUNTER — Other Ambulatory Visit (INDEPENDENT_AMBULATORY_CARE_PROVIDER_SITE_OTHER): Payer: Self-pay | Admitting: *Deleted

## 2015-10-09 DIAGNOSIS — Z800 Family history of malignant neoplasm of digestive organs: Secondary | ICD-10-CM | POA: Insufficient documentation

## 2015-11-13 ENCOUNTER — Other Ambulatory Visit (INDEPENDENT_AMBULATORY_CARE_PROVIDER_SITE_OTHER): Payer: Self-pay | Admitting: Internal Medicine

## 2015-11-14 NOTE — Telephone Encounter (Signed)
Patient will need to have an appointment prior to further refills.

## 2015-12-08 ENCOUNTER — Encounter (INDEPENDENT_AMBULATORY_CARE_PROVIDER_SITE_OTHER): Payer: Self-pay | Admitting: Internal Medicine

## 2015-12-08 NOTE — Telephone Encounter (Signed)
Patient was given an appointment for 12/18/15 at 9:30am with Deberah Castle, NP.  A letter was mailed to the patient.

## 2015-12-18 ENCOUNTER — Ambulatory Visit (INDEPENDENT_AMBULATORY_CARE_PROVIDER_SITE_OTHER): Payer: 59 | Admitting: Internal Medicine

## 2016-01-02 ENCOUNTER — Encounter (INDEPENDENT_AMBULATORY_CARE_PROVIDER_SITE_OTHER): Payer: Self-pay | Admitting: *Deleted

## 2016-01-02 ENCOUNTER — Telehealth (INDEPENDENT_AMBULATORY_CARE_PROVIDER_SITE_OTHER): Payer: Self-pay | Admitting: *Deleted

## 2016-01-02 NOTE — Telephone Encounter (Signed)
Patient needs trilyte 

## 2016-01-05 MED ORDER — PEG 3350-KCL-NA BICARB-NACL 420 G PO SOLR: 4000.0000 mL | Freq: Once | ORAL | 0 refills | Status: AC

## 2016-01-16 ENCOUNTER — Telehealth (INDEPENDENT_AMBULATORY_CARE_PROVIDER_SITE_OTHER): Payer: Self-pay | Admitting: *Deleted

## 2016-01-16 NOTE — Telephone Encounter (Signed)
agree

## 2016-01-16 NOTE — Telephone Encounter (Signed)
Referring MD/PCP: hawkins   Procedure: tcs  Reason/Indication:  fam hx colon ca  Has patient had this procedure before?  Yes, 2012  If so, when, by whom and where?    Is there a family history of colon cancer?  Yes, mother  Who?  What age when diagnosed?    Is patient diabetic?   no      Does patient have prosthetic heart valve or mechanical valve?  no  Do you have a pacemaker?  no  Has patient ever had endocarditis? no  Has patient had joint replacement within last 12 months?  no  Does patient tend to be constipated or take laxatives? no  Does patient have a history of alcohol/drug use?  no  Is patient on Coumadin, Plavix and/or Aspirin? no  Medications: synthroid 175 mcg daily, omeprazole 40 mg daily, birth control daily  Allergies: nkda  Medication Adjustment:   Procedure date & time: 02/12/16 at 730

## 2016-01-19 ENCOUNTER — Encounter (INDEPENDENT_AMBULATORY_CARE_PROVIDER_SITE_OTHER): Payer: Self-pay

## 2016-01-19 ENCOUNTER — Encounter (INDEPENDENT_AMBULATORY_CARE_PROVIDER_SITE_OTHER): Payer: Self-pay | Admitting: Internal Medicine

## 2016-01-19 ENCOUNTER — Ambulatory Visit (INDEPENDENT_AMBULATORY_CARE_PROVIDER_SITE_OTHER): Payer: 59 | Admitting: Internal Medicine

## 2016-01-19 VITALS — BP 102/60 | HR 60 | Temp 97.0°F | Ht 72.0 in | Wt 192.1 lb

## 2016-01-19 DIAGNOSIS — K219 Gastro-esophageal reflux disease without esophagitis: Secondary | ICD-10-CM | POA: Diagnosis not present

## 2016-01-19 MED ORDER — OMEPRAZOLE 40 MG PO CPDR: 40.0000 mg | DELAYED_RELEASE_CAPSULE | Freq: Every day | ORAL | 3 refills | Status: DC

## 2016-01-19 NOTE — Patient Instructions (Addendum)
OV in 1year.  Refill on Omeprazole.

## 2016-01-19 NOTE — Progress Notes (Signed)
   Subjective:    Patient ID: Elizabeth Lopez, female    DOB: 1955/12/22, 60 y.o.   MRN: [id number]  HPI Her today for f/u. She was last seen in 2015. Hx of GERD. Underwent an EGD in August of 2015 Impression: Abnormal appearance to mucosa of the esophagus with fine granularity. Biopsy taken.  Small sliding hiatal hernia without changes of erosive esophagitis. No evidence of peptic ulcer disease. Negative for Barrett's. She tells me she is doing good as long as she takes the Omeprazole. Appetite is good. No weight loss. She has a BM daily. Scheduled for a colonoscopy next month.  No change in her BMs.   02/12/2011 Colonoscopy: Family hx of colon cancer:  Impression:  Examination performed to cecum.  Two small polyps ablated via cold cold biopsy from sigmoid colon and submitted in one container.  Small external hemorrhoids.  Biopsy: Hyperplastic polyp Review of Systems Past Medical History:  Diagnosis Date  . Chronic constipation   . Family hx of colon cancer   . Hyperlipidemia   . Hyperthyroidism     Past Surgical History:  Procedure Laterality Date  . COLONOSCOPY    . COLONOSCOPY  02/12/2011   Procedure: COLONOSCOPY;  Surgeon: Rogene Houston, MD;  [facility];  Service: Endoscopy;  Laterality: N/A;  9:45  . ESOPHAGOGASTRODUODENOSCOPY N/A 10/05/2013   Procedure: ESOPHAGOGASTRODUODENOSCOPY (EGD);  Surgeon: Rogene Houston, MD;  [facility];  Service: Endoscopy;  Laterality: N/A;  1200-rescheduled to [place] notified pt  . knot     patient had a knot removed from right wrisit 9 years ago.  Marland Kitchen LASIK    . WRIST GANGLION EXCISION     right    No Known Allergies  Current Outpatient Prescriptions on File Prior to Visit  Medication Sig Dispense Refill  . Cholecalciferol (VITAMIN D PO) Take 2,000 mg by mouth daily.     Marland Kitchen levothyroxine (SYNTHROID, LEVOTHROID) 175 MCG tablet Take 1 tablet (175 mcg total) by mouth daily before breakfast. 90 tablet 4  .  omeprazole (PRILOSEC) 40 MG capsule TAKE ONE (1) CAPSULE EACH DAY 30 capsule 2  . vitamin B-12 (CYANOCOBALAMIN) 500 MCG tablet Take 500 mcg by mouth daily.      . vitamin C (ASCORBIC ACID) 500 MG tablet Take 500 mg by mouth daily.       No current facility-administered medications on file prior to visit.        Objective:   Physical Exam Blood pressure 102/60, pulse 60, temperature 97 F (36.1 C), height 6' (1.829 m), weight 192 lb 1.6 oz (87.1 kg).  Alert and oriented. Skin warm and dry. Oral mucosa is moist.   . Sclera anicteric, conjunctivae is pink. Thyroid not enlarged. No cervical lymphadenopathy. Lungs clear. Heart regular rate and rhythm.  Abdomen is soft. Bowel sounds are positive. No hepatomegaly. No abdominal masses felt. No tenderness.  No edema to lower extremities        Assessment & Plan:  GERD. Controlled with Omeprazole.  OV in 1 year.

## 2016-01-28 ENCOUNTER — Encounter (INDEPENDENT_AMBULATORY_CARE_PROVIDER_SITE_OTHER): Payer: Self-pay | Admitting: *Deleted

## 2016-02-12 ENCOUNTER — Encounter (HOSPITAL_COMMUNITY)
Admission: RE | Disposition: A | Discharge status: Home / Self Care | Payer: Self-pay | Source: Ambulatory Visit | Attending: Internal Medicine

## 2016-02-12 ENCOUNTER — Encounter (HOSPITAL_COMMUNITY): Payer: Self-pay | Admitting: *Deleted

## 2016-02-12 ENCOUNTER — Ambulatory Visit (HOSPITAL_COMMUNITY)
Admission: RE | Admit: 2016-02-12 | Discharge: 2016-02-12 | Disposition: A | Discharge status: Home / Self Care | Payer: 59 | Source: Ambulatory Visit | Attending: Internal Medicine | Admitting: Internal Medicine

## 2016-02-12 DIAGNOSIS — E785 Hyperlipidemia, unspecified: Secondary | ICD-10-CM | POA: Insufficient documentation

## 2016-02-12 DIAGNOSIS — K219 Gastro-esophageal reflux disease without esophagitis: Secondary | ICD-10-CM | POA: Diagnosis not present

## 2016-02-12 DIAGNOSIS — Z1211 Encounter for screening for malignant neoplasm of colon: Secondary | ICD-10-CM | POA: Insufficient documentation

## 2016-02-12 DIAGNOSIS — K644 Residual hemorrhoidal skin tags: Secondary | ICD-10-CM | POA: Diagnosis not present

## 2016-02-12 DIAGNOSIS — F419 Anxiety disorder, unspecified: Secondary | ICD-10-CM | POA: Insufficient documentation

## 2016-02-12 DIAGNOSIS — E039 Hypothyroidism, unspecified: Secondary | ICD-10-CM | POA: Diagnosis not present

## 2016-02-12 DIAGNOSIS — Z79899 Other long term (current) drug therapy: Secondary | ICD-10-CM | POA: Insufficient documentation

## 2016-02-12 DIAGNOSIS — Z800 Family history of malignant neoplasm of digestive organs: Secondary | ICD-10-CM | POA: Diagnosis not present

## 2016-02-12 HISTORY — DX: Anxiety disorder, unspecified: F41.9

## 2016-02-12 HISTORY — DX: Hypothyroidism, unspecified: E03.9

## 2016-02-12 HISTORY — PX: COLONOSCOPY: SHX5424

## 2016-02-12 HISTORY — DX: Gastro-esophageal reflux disease without esophagitis: K21.9

## 2016-02-12 SURGERY — COLONOSCOPY
Anesthesia: Moderate Sedation

## 2016-02-12 MED ORDER — SODIUM CHLORIDE 0.9 % IV SOLN
INTRAVENOUS | Status: DC
  Administered 2016-02-12: 07:00:00 via INTRAVENOUS

## 2016-02-12 MED ORDER — MEPERIDINE HCL 50 MG/ML IJ SOLN
INTRAMUSCULAR | Status: DC | PRN
  Administered 2016-02-12 (×2): 25 mg via INTRAVENOUS

## 2016-02-12 MED ORDER — SIMETHICONE 40 MG/0.6ML PO SUSP
ORAL | Status: DC | PRN
  Administered 2016-02-12: 2.5 mL

## 2016-02-12 MED ORDER — MIDAZOLAM HCL 5 MG/5ML IJ SOLN
INTRAMUSCULAR | Status: DC | PRN
Start: 2016-02-12 — End: 2016-02-12
  Administered 2016-02-12 (×3): 2 mg via INTRAVENOUS
  Administered 2016-02-12: 3 mg via INTRAVENOUS

## 2016-02-12 MED ORDER — MEPERIDINE HCL 50 MG/ML IJ SOLN
INTRAMUSCULAR | Status: AC
  Filled 2016-02-12: qty 1

## 2016-02-12 MED ORDER — MIDAZOLAM HCL 5 MG/5ML IJ SOLN
INTRAMUSCULAR | Status: AC
  Filled 2016-02-12: qty 10

## 2016-02-12 NOTE — Op Note (Signed)
[facility] Patient Name: Elizabeth Lopez Procedure Date: 02/12/2016 7:41 AM MRN: [id number] Date of Birth: April 23, 1955 Attending MD: Hildred Laser , MD CSN: [id number] Age: 60 Admit Type: Outpatient Procedure:                Colonoscopy Indications:              Screening in patient at increased risk: Colorectal                            cancer in mother before age 21 Providers:                Hildred Laser, MD, Charlyne Petrin RN, RN, Sherlyn Lees, Technician Referring MD:             Jasper Loser. Luan Pulling, MD Medicines:                Meperidine 50 mg IV, Midazolam 9 mg IV Complications:            No immediate complications. Estimated Blood Loss:     Estimated blood loss: none. Procedure:                Pre-Anesthesia Assessment:                           - Prior to the procedure, a History and Physical                            was performed, and patient medications and                            allergies were reviewed. The patient's tolerance of                            previous anesthesia was also reviewed. The risks                            and benefits of the procedure and the sedation                            options and risks were discussed with the patient.                            All questions were answered, and informed consent                            was obtained. Prior Anticoagulants: The patient                            last took naproxen 4 days prior to the procedure.                            ASA Grade Assessment: II - A patient with mild  systemic disease. After reviewing the risks and                            benefits, the patient was deemed in satisfactory                            condition to undergo the procedure.                           After obtaining informed consent, the colonoscope                            was passed under direct vision. Throughout the      procedure, the patient's blood pressure, pulse, and                            oxygen saturations were monitored continuously. The                            [id number]) scope was introduced through                            the anus and advanced to the the cecum, identified                            by appendiceal orifice and ileocecal valve. The                            colonoscopy was somewhat difficult due to a                            tortuous colon. Successful completion of the                            procedure was aided by changing the patient to a                            supine position and using manual pressure. The                            patient tolerated the procedure well. The quality                            of the bowel preparation was excellent. The                            ileocecal valve, appendiceal orifice, and rectum                            were photographed. Scope In: [id number] AM Scope Out: 8:12:01 AM Scope Withdrawal Time: 0 hours 7 minutes 48 seconds  Total Procedure Duration: 0 hours 25 minutes 47 seconds  Findings:      The perianal and digital rectal examinations were normal.  The colon (entire examined portion) appeared normal.      External hemorrhoids were found during retroflexion. The hemorrhoids       were small. Impression:               - The entire examined colon is normal.                           - External hemorrhoids.                           - No specimens collected. Moderate Sedation:      Moderate (conscious) sedation was administered by the endoscopy nurse       and supervised by the endoscopist. The following parameters were       monitored: oxygen saturation, heart rate, blood pressure, CO2       capnography and response to care. Total physician intraservice time was       34 minutes. Recommendation:           - Patient has a contact number available for                            emergencies. The  signs and symptoms of potential                            delayed complications were discussed with the                            patient. Return to normal activities tomorrow.                            Written discharge instructions were provided to the                            patient.                           - Resume previous diet today.                           - Continue present medications.                           - Repeat colonoscopy in 5 years for screening                            purposes. Procedure Code(s):        --- Professional ---                           [phone], Colonoscopy, flexible; diagnostic, including                            collection of specimen(s) by brushing or washing,                            when performed (separate procedure)  [id number], Moderate sedation services provided by the                            same physician or other qualified health care                            professional performing the diagnostic or                            therapeutic service that the sedation supports,                            requiring the presence of an independent trained                            observer to assist in the monitoring of the                            patient's level of consciousness and physiological                            status; initial 15 minutes of intraservice time,                            patient age 53 years or older                           [phone], Moderate sedation services; each additional                            15 minutes intraservice time Diagnosis Code(s):        --- Professional ---                           Z80.0, Family history of malignant neoplasm of                            digestive organs                           K64.4, Residual hemorrhoidal skin tags CPT copyright 2016 American Medical Association. All rights reserved. The codes documented in this report are preliminary and  upon coder review may  be revised to meet current compliance requirements. Hildred Laser, MD Hildred Laser, MD 02/12/2016 8:19:06 AM This report has been signed electronically. Number of Addenda: 0

## 2016-02-12 NOTE — H&P (Signed)
Elizabeth Lopez is an 60 y.o. female.   Chief Complaint: Patient is here for colonoscopy. HPI: Patient 60 year old African female who is here for screening colonoscopy. She denies abdominal pain change in bowel habits or rectal bleeding. Last exam was exactly 5 years ago. Family history significant for CRC in mother who was 52 at the time of diagnosis and oto-metastatic disease 13 years later.  Past Medical History:  Diagnosis Date  . Anxiety   . Chronic constipation   . Family hx of colon cancer   . GERD (gastroesophageal reflux disease)   . Hyperlipidemia   . Hypothyroidism     Past Surgical History:  Procedure Laterality Date  . COLONOSCOPY    . COLONOSCOPY  02/12/2011   Procedure: COLONOSCOPY;  Surgeon: Rogene Houston, MD;  [facility]: [facility];  Service: Endoscopy;  Laterality: N/A;  9:45  . ESOPHAGOGASTRODUODENOSCOPY N/A 10/05/2013   Procedure: ESOPHAGOGASTRODUODENOSCOPY (EGD);  Surgeon: Rogene Houston, MD;  Location: [facility];  Service: Endoscopy;  Laterality: N/A;  1200-rescheduled to [place] notified pt  . knot     patient had a knot removed from right wrisit 9 years ago.  Marland Kitchen LASIK    . WRIST GANGLION EXCISION     right    Family History  Problem Relation Age of Onset  . Colon cancer Mother   . Cancer Mother   . Healthy Sister   . Healthy Brother   . Healthy Sister   . Healthy Sister   . Healthy Brother   . Healthy Brother   . Cancer Father    Social History:  reports that she has never smoked. She has never used smokeless tobacco. She reports that she drinks alcohol. She reports that she does not use drugs.  Allergies: No Known Allergies  Medications Prior to Admission  Medication Sig Dispense Refill  . ALPRAZolam (XANAX) 0.5 MG tablet Take 0.5 mg by mouth 4 (four) times daily as needed for anxiety.    . Cholecalciferol (VITAMIN D PO) Take 1 capsule by mouth daily.     Marland Kitchen levothyroxine (SYNTHROID, LEVOTHROID) 175 MCG tablet Take 1 tablet (175 mcg  total) by mouth daily before breakfast. 90 tablet 4  . naproxen sodium (ANAPROX) 220 MG tablet Take 440 mg by mouth daily as needed (headaches).    Marland Kitchen omeprazole (PRILOSEC) 40 MG capsule Take 1 capsule (40 mg total) by mouth daily. 90 capsule 3  . vitamin B-12 (CYANOCOBALAMIN) 500 MCG tablet Take 500 mcg by mouth daily.      . vitamin C (ASCORBIC ACID) 500 MG tablet Take 500 mg by mouth daily.        No results found for this or any previous visit (from the past 48 hour(s)). No results found.  ROS  Blood pressure 139/87, pulse 66, temperature 98.6 F (37 C), temperature source Oral, resp. rate 11, height 6' (1.829 m), weight 185 lb (83.9 kg), SpO2 98 %. Physical Exam  Constitutional: She appears well-developed and well-nourished.  HENT:  Mouth/Throat: Oropharynx is clear and moist.  Eyes: Conjunctivae are normal. No scleral icterus.  Neck: No thyromegaly present.  Cardiovascular: Normal rate, regular rhythm and normal heart sounds.   Respiratory: Effort normal and breath sounds normal.  GI: Soft. She exhibits no distension and no mass. There is no tenderness.  Musculoskeletal: She exhibits no edema.  Lymphadenopathy:    She has no cervical adenopathy.  Neurological: She is alert.  Skin: Skin is warm and dry.  Assessment/Plan High risk screening colonoscopy.  Hildred Laser, MD 02/12/2016, 7:32 AM

## 2016-02-12 NOTE — Discharge Instructions (Signed)
Resume usual medications and diet. No driving for 24 hours. Next colonoscopy in 5 years.   Colonoscopy, Adult, Care After This sheet gives you information about how to care for yourself after your procedure. Your doctor may also give you more specific instructions. If you have problems or questions, call your doctor. Follow these instructions at home: General instructions  For the first 24 hours after the procedure:  Do not drive or use machinery.  Do not sign important documents.  Do not drink alcohol.  Do your daily activities more slowly than normal.  Eat foods that are soft and easy to digest.  Rest often.  Take over-the-counter or prescription medicines only as told by your doctor.  It is up to you to get the results of your procedure. Ask your doctor, or the department performing the procedure, when your results will be ready. To help cramping and bloating:  Try walking around.  Put heat on your belly (abdomen) as told by your doctor. Use a heat source that your doctor recommends, such as a moist heat pack or a heating pad.  Put a towel between your skin and the heat source.  Leave the heat on for 20-30 minutes.  Remove the heat if your skin turns bright red. This is especially important if you cannot feel pain, heat, or cold. You can get burned. Eating and drinking  Drink enough fluid to keep your pee (urine) clear or pale yellow.  Return to your normal diet as told by your doctor. Avoid heavy or fried foods that are hard to digest.  Avoid drinking alcohol for as long as told by your doctor. Contact a doctor if:  You have blood in your poop (stool) 2-3 days after the procedure. Get help right away if:  You have more than a small amount of blood in your poop.  You see large clumps of tissue (blood clots) in your poop.  Your belly is swollen.  You feel sick to your stomach (nauseous).  You throw up (vomit).  You have a fever.  You have belly pain  that gets worse, and medicine does not help your pain. This information is not intended to replace advice given to you by your health care provider. Make sure you discuss any questions you have with your health care provider. Document Released: 03/20/2010 Document Revised: 11/10/2015 Document Reviewed: 11/10/2015 Elsevier Interactive Patient Education  2017 Else[employer] Inc.    Hemorrhoids Hemorrhoids are swollen veins in and around the rectum or anus. There are two types of hemorrhoids:  Internal hemorrhoids. These occur in the veins that are just inside the rectum. They may poke through to the outside and become irritated and painful.  External hemorrhoids. These occur in the veins that are outside of the anus and can be felt as a painful swelling or hard lump near the anus. Most hemorrhoids do not cause serious problems, and they can be managed with home treatments such as diet and lifestyle changes. If home treatments do not help your symptoms, procedures can be done to shrink or remove the hemorrhoids. What are the causes? This condition is caused by increased pressure in the anal area. This pressure may result from various things, including:  Constipation.  Straining to have a bowel movement.  Diarrhea.  Pregnancy.  Obesity.  Sitting for long periods of time.  Heavy lifting or other activity that causes you to strain.  Anal sex. What are the signs or symptoms? Symptoms of this condition include:  Pain.  Anal itching or irritation.  Rectal bleeding.  Leakage of stool (feces).  Anal swelling.  One or more lumps around the anus. How is this diagnosed? This condition can often be diagnosed through a visual exam. Other exams or tests may also be done, such as:  Examination of the rectal area with a gloved hand (digital rectal exam).  Examination of the anal canal using a small tube (anoscope).  A blood test, if you have lost a significant amount of blood.  A test  to look inside the colon (sigmoidoscopy or colonoscopy). How is this treated? This condition can usually be treated at home. However, various procedures may be done if dietary changes, lifestyle changes, and other home treatments do not help your symptoms. These procedures can help make the hemorrhoids smaller or remove them completely. Some of these procedures involve surgery, and others do not. Common procedures include:  Rubber band ligation. Rubber bands are placed at the base of the hemorrhoids to cut off the blood supply to them.  Sclerotherapy. Medicine is injected into the hemorrhoids to shrink them.  Infrared coagulation. A type of light energy is used to get rid of the hemorrhoids.  Hemorrhoidectomy surgery. The hemorrhoids are surgically removed, and the veins that supply them are tied off.  Stapled hemorrhoidopexy surgery. A circular stapling device is used to remove the hemorrhoids and use staples to cut off the blood supply to them. Follow these instructions at home: Eating and drinking  Eat foods that have a lot of fiber in them, such as whole grains, beans, nuts, fruits, and vegetables. Ask your health care provider about taking products that have added fiber (fiber supplements).  Drink enough fluid to keep your urine clear or pale yellow. Managing pain and swelling  Take warm sitz baths for 20 minutes, 3-4 times a day to ease pain and discomfort.  If directed, apply ice to the affected area. Using ice packs between sitz baths may be helpful.  Put ice in a plastic bag.  Place a towel between your skin and the bag.  Leave the ice on for 20 minutes, 2-3 times a day. General instructions  Take over-the-counter and prescription medicines only as told by your health care provider.  Use medicated creams or suppositories as told.  Exercise regularly.  Go to the bathroom when you have the urge to have a bowel movement. Do not wait.  Avoid straining to have bowel  movements.  Keep the anal area dry and clean. Use wet toilet paper or moist towelettes after a bowel movement.  Do not sit on the toilet for long periods of time. This increases blood pooling and pain. Contact a health care provider if:  You have increasing pain and swelling that are not controlled by treatment or medicine.  You have uncontrolled bleeding.  You have difficulty having a bowel movement, or you are unable to have a bowel movement.  You have pain or inflammation outside the area of the hemorrhoids. This information is not intended to replace advice given to you by your health care provider. Make sure you discuss any questions you have with your health care provider. Document Released: 02/13/2000 Document Revised: 07/16/2015 Document Reviewed: 10/30/2014 Elsevier Interactive Patient Education  2017 [employer].

## 2016-02-16 ENCOUNTER — Encounter (HOSPITAL_COMMUNITY): Payer: Self-pay | Admitting: Internal Medicine

## 2016-05-20 ENCOUNTER — Other Ambulatory Visit: Payer: Self-pay

## 2016-05-20 MED ORDER — LEVOTHYROXINE SODIUM 175 MCG PO TABS: 175.0000 ug | ORAL_TABLET | Freq: Every day | ORAL | 0 refills | Status: DC

## 2016-05-21 ENCOUNTER — Telehealth: Payer: Self-pay

## 2016-05-21 NOTE — Telephone Encounter (Signed)
Pts synthroid copay has increased. She was asking if there was a reason why she needed brand name or can she try generic.

## 2016-05-21 NOTE — Telephone Encounter (Signed)
She can take generic levothyroxine , same dose.

## 2016-05-24 MED ORDER — LEVOTHYROXINE SODIUM 175 MCG PO TABS: 175.0000 ug | ORAL_TABLET | Freq: Every day | ORAL | 0 refills | Status: DC

## 2016-06-19 DIAGNOSIS — K279 Peptic ulcer, site unspecified, unspecified as acute or chronic, without hemorrhage or perforation: Secondary | ICD-10-CM | POA: Diagnosis not present

## 2016-06-19 DIAGNOSIS — E785 Hyperlipidemia, unspecified: Secondary | ICD-10-CM | POA: Diagnosis not present

## 2016-06-22 DIAGNOSIS — Z0000 Encounter for general adult medical examination without abnormal findings: Secondary | ICD-10-CM | POA: Diagnosis not present

## 2016-08-10 ENCOUNTER — Other Ambulatory Visit: Payer: Self-pay | Admitting: "Endocrinology

## 2016-08-10 DIAGNOSIS — E039 Hypothyroidism, unspecified: Secondary | ICD-10-CM

## 2016-08-11 ENCOUNTER — Telehealth: Payer: Self-pay | Admitting: "Endocrinology

## 2016-08-11 NOTE — Telephone Encounter (Signed)
Elizabeth Lopez called on Tuesday afternoon at 5:00 and left 2 voicemails being irate stating she had to speed [place] from work and almost got a ticket to get to the lab to have labs drawn before her appointment on Thursday 08/12/16 and states that we told her personally to go to [facility] to have lab work done which never happened and when she got there they did not have her orders but she states that we told her to go to [facility] because she told us she cant go back to [facility] anymore which she never told us that. Now she is cancelling her appointment on Thursday because she couldn't have labs drawn and is asking Dr. Dorris Fetch for a Med refill until her next visit because she is completely out, please advise?

## 2016-08-12 ENCOUNTER — Other Ambulatory Visit: Payer: Self-pay | Admitting: "Endocrinology

## 2016-08-12 ENCOUNTER — Ambulatory Visit: Payer: 59 | Admitting: "Endocrinology

## 2016-08-12 DIAGNOSIS — L658 Other specified nonscarring hair loss: Secondary | ICD-10-CM | POA: Diagnosis not present

## 2016-08-12 DIAGNOSIS — L578 Other skin changes due to chronic exposure to nonionizing radiation: Secondary | ICD-10-CM | POA: Diagnosis not present

## 2016-08-12 DIAGNOSIS — E039 Hypothyroidism, unspecified: Secondary | ICD-10-CM | POA: Diagnosis not present

## 2016-08-12 MED ORDER — LEVOTHYROXINE SODIUM 175 MCG PO TABS: 175.0000 ug | ORAL_TABLET | Freq: Every day | ORAL | 0 refills | Status: DC

## 2016-08-13 LAB — T4, FREE: FREE T4: 1.79 ng/dL — AB (ref 0.82–1.77)

## 2016-08-13 LAB — TSH: TSH: 0.072 u[IU]/mL — ABNORMAL LOW (ref 0.450–4.500)

## 2016-08-16 DIAGNOSIS — M65311 Trigger thumb, right thumb: Secondary | ICD-10-CM | POA: Diagnosis not present

## 2016-08-18 ENCOUNTER — Ambulatory Visit (INDEPENDENT_AMBULATORY_CARE_PROVIDER_SITE_OTHER): Payer: 59 | Admitting: "Endocrinology

## 2016-08-18 ENCOUNTER — Encounter: Payer: Self-pay | Admitting: "Endocrinology

## 2016-08-18 VITALS — BP 124/79 | HR 71 | Ht 72.0 in | Wt 193.0 lb

## 2016-08-18 DIAGNOSIS — E890 Postprocedural hypothyroidism: Secondary | ICD-10-CM | POA: Diagnosis not present

## 2016-08-18 MED ORDER — LEVOTHYROXINE SODIUM 150 MCG PO TABS: 150.0000 ug | ORAL_TABLET | Freq: Every day | ORAL | 6 refills | Status: DC

## 2016-08-18 NOTE — Progress Notes (Signed)
Subjective:    Patient ID: Elizabeth Lopez, female    DOB: [birth year], PCP Sinda Du, MD   Past Medical History:  Diagnosis Date  . Anxiety   . Chronic constipation   . Family hx of colon cancer   . GERD (gastroesophageal reflux disease)   . Hyperlipidemia   . Hypothyroidism    Past Surgical History:  Procedure Laterality Date  . COLONOSCOPY    . COLONOSCOPY  02/12/2011   Procedure: COLONOSCOPY;  Surgeon: Rogene Houston, MD;  [facility];  Service: Endoscopy;  Laterality: N/A;  9:45  . COLONOSCOPY N/A 02/12/2016   Procedure: COLONOSCOPY;  Surgeon: Rogene Houston, MD;  [facility]: [facility];  Service: Endoscopy;  Laterality: N/A;  730  . ESOPHAGOGASTRODUODENOSCOPY N/A 10/05/2013   Procedure: ESOPHAGOGASTRODUODENOSCOPY (EGD);  Surgeon: Rogene Houston, MD;  [facility]: [facility];  Service: Endoscopy;  Laterality: N/A;  1200-rescheduled to [place] notified pt  . knot     patient had a knot removed from right wrisit 9 years ago.  Marland Kitchen LASIK    . WRIST GANGLION EXCISION     right   Social History   Social History  . Marital status: Married    Spouse name: N/A  . Number of children: N/A  . Years of education: N/A   Social History Main Topics  . Smoking status: Never Smoker  . Smokeless tobacco: Never Used  . Alcohol use Yes     Comment: occasional wine  . Drug use: No  . Sexual activity: Not Asked   Other Topics Concern  . None   Social History Narrative  . None   Outpatient Encounter Prescriptions as of 08/18/2016  Medication Sig  . ALPRAZolam (XANAX) 0.5 MG tablet Take 0.5 mg by mouth 4 (four) times daily as needed for anxiety.  . Cholecalciferol (VITAMIN D PO) Take 1 capsule by mouth daily.   Marland Kitchen levothyroxine (SYNTHROID, LEVOTHROID) 150 MCG tablet Take 1 tablet (150 mcg total) by mouth daily before breakfast.  . naproxen sodium (ANAPROX) 220 MG tablet Take 440 mg by mouth daily as needed (headaches).  Marland Kitchen omeprazole (PRILOSEC) 40 MG capsule  Take 1 capsule (40 mg total) by mouth daily.  . vitamin B-12 (CYANOCOBALAMIN) 500 MCG tablet Take 500 mcg by mouth daily.    . vitamin C (ASCORBIC ACID) 500 MG tablet Take 500 mg by mouth daily.    . [DISCONTINUED] levothyroxine (SYNTHROID, LEVOTHROID) 175 MCG tablet Take 1 tablet (175 mcg total) by mouth daily before breakfast.   No facility-administered encounter medications on file as of 08/18/2016.    ALLERGIES: No Known Allergies VACCINATION STATUS:  There is no immunization history on file for this patient.  HPI  61 yo female with with Graves disease s/p I131 therapy on Aril 2012. She is on Synthroid 175 mcg po qam.   She came with  thyroid function test. she denies cold intolerance, heat intolerance, and palpitation.    Review of Systems  Constitutional: + steady weigh, no fatigue, no subjective hyperthermia/hypothermia Eyes: no blurry vision, no xerophthalmia ENT: no sore throat, no nodules palpated in throat, no dysphagia/odynophagia, no hoarseness Cardiovascular: no CP/SOB/palpitations/leg swelling Respiratory: no cough/SOB Gastrointestinal: no N/V/D/C Musculoskeletal: no muscle/joint aches Skin: no rashes Neurological: no tremors/numbness/tingling/dizziness Psychiatric: no depression/anxiety   Objective:    BP 124/79   Pulse 71   Ht 6' (1.829 m)   Wt 193 lb (87.5 kg)   BMI 26.18 kg/m   Wt Readings  from Last 3 Encounters:  08/18/16 193 lb (87.5 kg)  02/12/16 185 lb (83.9 kg)  01/19/16 192 lb 1.6 oz (87.1 kg)    Physical Exam  Constitutional:  in NAD Eyes: PERRLA, EOMI, no exophthalmos ENT: moist mucous membranes, no thyromegaly, no cervical lymphadenopathy Skin: moist, warm, no rashes Neurological: no tremor with outstretched hands, DTR normal in all 4  Results for orders placed or performed in visit on 08/12/16  TSH  Result Value Ref Range   TSH 0.072 (L) 0.450 - 4.500 uIU/mL   Complete Blood Count (Most recent): Lab Results  Component Value Date    HGB 11.8 (A) 06/16/2010   HCT 37 06/16/2010   PLT 279 06/16/2010   Chemistry (most recent): Lab Results  Component Value Date   NA 138 06/16/2010   K 4.5 06/16/2010   CL 103 06/10/2010   BUN 10 06/10/2010   CREATININE 0.6 06/10/2010   Results for Bogen, Elizabeth Lopez (MRN [id number]) as of 08/18/2016 15:48  Ref. Range 07/31/2015 16:54  08/12/2016 16:03 08/12/2016 16:04  TSH Latest Ref Range: 0.450 - 4.500 uIU/mL 0.26 (L)   0.072 (L)  T4,Free(Direct) Latest Ref Range: 0.82 - 1.77 ng/dL 1.6  1.79 (H)      Assessment & Plan:    Hypothyroidism due to RAI   - Her Thyroid function tests are consistent with over replacement. I will lower her levothyroxine to 150 g by mouth every morning.   - We discussed about correct intake of levothyroxine, at fasting, with water, separated by at least 30 minutes from breakfast, and separated by more than 4 hours from calcium, iron, multivitamins, acid reflux medications (PPIs). -Patient is made aware of the fact that thyroid hormone replacement is needed for life, dose to be adjusted by periodic monitoring of thyroid function tests. I urged her to get thyroid function test before her next visit in 6 months.  - I advised patient to maintain close follow up with Sinda Du, MD for primary care needs. Follow up plan: Return in about 6 months (around 02/17/2017) for follow up with pre-visit labs.  Glade Lloyd, MD Phone: [phone]  Fax: [phone]   08/18/2016, 3:58 PM

## 2016-09-16 DIAGNOSIS — B86 Scabies: Secondary | ICD-10-CM | POA: Diagnosis not present

## 2016-10-20 DIAGNOSIS — N950 Postmenopausal bleeding: Secondary | ICD-10-CM | POA: Diagnosis not present

## 2016-11-26 ENCOUNTER — Encounter (INDEPENDENT_AMBULATORY_CARE_PROVIDER_SITE_OTHER): Payer: Self-pay | Admitting: Internal Medicine

## 2016-11-26 ENCOUNTER — Encounter (INDEPENDENT_AMBULATORY_CARE_PROVIDER_SITE_OTHER): Payer: Self-pay

## 2016-12-22 DIAGNOSIS — M65311 Trigger thumb, right thumb: Secondary | ICD-10-CM | POA: Diagnosis not present

## 2016-12-30 DIAGNOSIS — Z1231 Encounter for screening mammogram for malignant neoplasm of breast: Secondary | ICD-10-CM | POA: Diagnosis not present

## 2017-01-10 DIAGNOSIS — H524 Presbyopia: Secondary | ICD-10-CM | POA: Diagnosis not present

## 2017-01-18 ENCOUNTER — Ambulatory Visit (INDEPENDENT_AMBULATORY_CARE_PROVIDER_SITE_OTHER): Payer: 59 | Admitting: Internal Medicine

## 2017-02-02 DIAGNOSIS — M65311 Trigger thumb, right thumb: Secondary | ICD-10-CM | POA: Diagnosis not present

## 2017-02-07 ENCOUNTER — Ambulatory Visit (INDEPENDENT_AMBULATORY_CARE_PROVIDER_SITE_OTHER): Payer: 59 | Admitting: Internal Medicine

## 2017-02-09 ENCOUNTER — Other Ambulatory Visit (INDEPENDENT_AMBULATORY_CARE_PROVIDER_SITE_OTHER): Payer: Self-pay | Admitting: Internal Medicine

## 2017-02-09 DIAGNOSIS — K219 Gastro-esophageal reflux disease without esophagitis: Secondary | ICD-10-CM

## 2017-02-11 ENCOUNTER — Telehealth: Payer: Self-pay | Admitting: "Endocrinology

## 2017-02-11 NOTE — Telephone Encounter (Signed)
She has to do her labs first, and then reschedule her visit. We have to see her to discuss the results and see if she needs adjustment in her dose. She can have a refill if she ran out of her medication.

## 2017-02-11 NOTE — Telephone Encounter (Signed)
Elizabeth Lopez is calling stating that due to the weather and her job she cant keep Tuesdays appointment but she is going to try and get by the lab to have labs drawn and she is asking if Dr. Dorris Fetch would just take a look at her lab results and see if her medication needs to be adjusted instead of her coming in the office now, please advise?

## 2017-02-14 ENCOUNTER — Other Ambulatory Visit: Payer: Self-pay | Admitting: "Endocrinology

## 2017-02-14 DIAGNOSIS — E890 Postprocedural hypothyroidism: Secondary | ICD-10-CM | POA: Diagnosis not present

## 2017-02-14 NOTE — Telephone Encounter (Signed)
She is aware 

## 2017-02-15 ENCOUNTER — Ambulatory Visit: Payer: 59 | Admitting: "Endocrinology

## 2017-02-15 LAB — TSH: TSH: 1.08 u[IU]/mL (ref 0.450–4.500)

## 2017-02-15 LAB — T4, FREE: FREE T4: 1.52 ng/dL (ref 0.82–1.77)

## 2017-02-17 DIAGNOSIS — Z01419 Encounter for gynecological examination (general) (routine) without abnormal findings: Secondary | ICD-10-CM | POA: Diagnosis not present

## 2017-02-24 ENCOUNTER — Ambulatory Visit (INDEPENDENT_AMBULATORY_CARE_PROVIDER_SITE_OTHER): Payer: 59 | Admitting: "Endocrinology

## 2017-02-24 ENCOUNTER — Encounter: Payer: Self-pay | Admitting: "Endocrinology

## 2017-02-24 ENCOUNTER — Ambulatory Visit (INDEPENDENT_AMBULATORY_CARE_PROVIDER_SITE_OTHER): Payer: 59 | Admitting: Internal Medicine

## 2017-02-24 ENCOUNTER — Telehealth (INDEPENDENT_AMBULATORY_CARE_PROVIDER_SITE_OTHER): Payer: Self-pay | Admitting: Internal Medicine

## 2017-02-24 VITALS — BP 131/85 | HR 77 | Wt 201.0 lb

## 2017-02-24 DIAGNOSIS — E663 Overweight: Secondary | ICD-10-CM

## 2017-02-24 DIAGNOSIS — E890 Postprocedural hypothyroidism: Secondary | ICD-10-CM | POA: Diagnosis not present

## 2017-02-24 MED ORDER — LEVOTHYROXINE SODIUM 150 MCG PO TABS: 150.0000 ug | ORAL_TABLET | Freq: Every day | ORAL | 4 refills | Status: DC

## 2017-02-24 NOTE — Progress Notes (Signed)
Subjective:    Patient ID: Elizabeth Lopez, female    DOB: 1956-02-21, PCP Sinda Du, MD   Past Medical History:  Diagnosis Date  . Anxiety   . Chronic constipation   . Family hx of colon cancer   . GERD (gastroesophageal reflux disease)   . Hyperlipidemia   . Hypothyroidism    Past Surgical History:  Procedure Laterality Date  . COLONOSCOPY    . COLONOSCOPY  02/12/2011   Procedure: COLONOSCOPY;  Surgeon: Rogene Houston, MD;  [facility];  Service: Endoscopy;  Laterality: N/A;  9:45  . COLONOSCOPY N/A 02/12/2016   Procedure: COLONOSCOPY;  Surgeon: Rogene Houston, MD;  [facility];  Service: Endoscopy;  Laterality: N/A;  730  . ESOPHAGOGASTRODUODENOSCOPY N/A 10/05/2013   Procedure: ESOPHAGOGASTRODUODENOSCOPY (EGD);  Surgeon: Rogene Houston, MD;  [facility];  Service: Endoscopy;  Laterality: N/A;  1200-rescheduled to [place] notified pt  . knot     patient had a knot removed from right wrisit 9 years ago.  Marland Kitchen LASIK    . WRIST GANGLION EXCISION     right   Social History   Socioeconomic History  . Marital status: Married    Spouse name: None  . Number of children: None  . Years of education: None  . Highest education level: None  Social Needs  . Financial resource strain: None  . Food insecurity - worry: None  . Food insecurity - inability: None  . Transportation needs - medical: None  . Transportation needs - non-medical: None  Occupational History  . None  Tobacco Use  . Smoking status: Never Smoker  . Smokeless tobacco: Never Used  Substance and Sexual Activity  . Alcohol use: Yes    Comment: occasional wine  . Drug use: No  . Sexual activity: None  Other Topics Concern  . None  Social History Narrative  . None   Outpatient Encounter Medications as of 02/24/2017  Medication Sig  . ALPRAZolam (XANAX) 0.5 MG tablet Take 0.5 mg by mouth 4 (four) times daily as needed for anxiety.  . Cholecalciferol (VITAMIN D  PO) Take 1 capsule by mouth daily.   Marland Kitchen levothyroxine (SYNTHROID, LEVOTHROID) 150 MCG tablet Take 1 tablet (150 mcg total) by mouth daily before breakfast.  . naproxen sodium (ANAPROX) 220 MG tablet Take 440 mg by mouth daily as needed (headaches).  Marland Kitchen omeprazole (PRILOSEC) 40 MG capsule TAKE ONE CAPSULE BY MOUTH DAILY  . vitamin B-12 (CYANOCOBALAMIN) 500 MCG tablet Take 500 mcg by mouth daily.    . vitamin C (ASCORBIC ACID) 500 MG tablet Take 500 mg by mouth daily.    . [DISCONTINUED] levothyroxine (SYNTHROID, LEVOTHROID) 150 MCG tablet Take 1 tablet (150 mcg total) by mouth daily before breakfast.   No facility-administered encounter medications on file as of 02/24/2017.    ALLERGIES: No Known Allergies VACCINATION STATUS:  There is no immunization history on file for this patient.  HPI  61 yo female with with Graves disease status po I131 therapy on Aril 2012. She is on Synthroid 150 mcg po qam.  she has no new complaints today, except that she cannot lose weight.  She came with  thyroid function test. she denies cold intolerance, heat intolerance, and palpitation.    Review of Systems  Constitutional: + Progress weight gain, no fatigue, no subjective hyperthermia/hypothermia Eyes: no blurry vision, no xerophthalmia ENT: no sore throat, no nodules palpated in throat, no dysphagia/odynophagia, no  hoarseness Cardiovascular: no CP/SOB/palpitations/leg swelling Respiratory: no cough/SOB Gastrointestinal: no N/V/D/C Musculoskeletal: no muscle/joint aches Skin: no rashes Neurological: no tremors/numbness/tingling/dizziness Psychiatric: no depression/anxiety   Objective:    BP 131/85   Pulse 77   Wt 201 lb (91.2 kg)   BMI 27.26 kg/m   Wt Readings from Last 3 Encounters:  02/24/17 201 lb (91.2 kg)  08/18/16 193 lb (87.5 kg)  02/12/16 185 lb (83.9 kg)    Physical Exam  Constitutional:  in NAD Eyes: PERRLA, EOMI, no exophthalmos ENT: moist mucous membranes, no thyromegaly, no  cervical lymphadenopathy Skin: moist, warm, no rashes Neurological: no tremor with outstretched hands, DTR normal in all 4  Results for orders placed or performed in visit on 02/14/17  T4, free  Result Value Ref Range   Free T4 1.52 0.82 - 1.77 ng/dL  TSH  Result Value Ref Range   TSH 1.080 0.450 - 4.500 uIU/mL   Complete Blood Count (Most recent): Lab Results  Component Value Date   HGB 11.8 (A) 06/16/2010   HCT 37 06/16/2010   PLT 279 06/16/2010   Chemistry (most recent): Lab Results  Component Value Date   NA 138 06/16/2010   K 4.5 06/16/2010   CL 103 06/10/2010   BUN 10 06/10/2010   CREATININE 0.6 06/10/2010      Assessment & Plan:    Hypothyroidism due to RAI   - Her Thyroid function tests are consistent with appropriate replacement. - I advised her to continue levothyroxine 150 g by mouth every morning.  - We discussed about correct intake of levothyroxine, at fasting, with water, separated by at least 30 minutes from breakfast, and separated by more than 4 hours from calcium, iron, multivitamins, acid reflux medications (PPIs). -Patient is made aware of the fact that thyroid hormone replacement is needed for life, dose to be adjusted by periodic monitoring of thyroid function tests. - Regarding her inability to lose weight, BMI of 27, she is interested to consult with a dietitian. I will make a referral. - I advised patient to maintain close follow up with Sinda Du, MD for primary care needs. Follow up plan: Return in about 1 year (around 02/24/2018) for follow up with pre-visit labs.  Glade Lloyd, MD Phone: [phone]  Fax: [phone]  -  This note was partially dictated with voice recognition software. Similar sounding words can be transcribed inadequately or may not  be corrected upon review.  02/24/2017, 12:53 PM

## 2017-02-24 NOTE — Telephone Encounter (Signed)
Patient came in for her appointment with Deberah Castle, NP today and when I asked for her $40 copay, she stated that she only paid $30 last year.  The patient and I looked at her insurance card and I showed her where it says the specialist copay is $40.  She stated that she was not seeing the specialist, she was seeing the nurse and had a real problem paying $40 when she's not seeing the doctor.  I explained that Karna Christmas is not a Marine scientist, she is a NP.  She stated she only paid Dr. Sinda Du $30, I explained that is her copay for a primary care physician.  She got on the phone with her insurance company, [facility] and the patient at first tried to tell me that Deberah Castle, NP wasn't even listed.  I explained that she was, that it would probably be listed under [facility].  After she got off the phone with [facility], she came up and asked to reschedule with Dr. Laural Golden, I gave her his next available appointment, 08/09/17 at 3:00pm.

## 2017-05-05 DIAGNOSIS — E039 Hypothyroidism, unspecified: Secondary | ICD-10-CM | POA: Diagnosis not present

## 2017-05-05 DIAGNOSIS — M79644 Pain in right finger(s): Secondary | ICD-10-CM | POA: Diagnosis not present

## 2017-05-05 DIAGNOSIS — K210 Gastro-esophageal reflux disease with esophagitis: Secondary | ICD-10-CM | POA: Diagnosis not present

## 2017-06-17 DIAGNOSIS — E785 Hyperlipidemia, unspecified: Secondary | ICD-10-CM | POA: Diagnosis not present

## 2017-06-17 DIAGNOSIS — E039 Hypothyroidism, unspecified: Secondary | ICD-10-CM | POA: Diagnosis not present

## 2017-06-17 DIAGNOSIS — K210 Gastro-esophageal reflux disease with esophagitis: Secondary | ICD-10-CM | POA: Diagnosis not present

## 2017-06-18 LAB — CBC WITH DIFF/PLATELET
BASO(ABSOLUTE): 0
Basophils: 0
Eosinophils Absolute: 0
Eosinophils, %: 2
HCT: 38 (ref 29–41)
Hemoglobin: 13
Immature Granulocytes: 0
LYMPH: 33 %
Lymphs Abs: 2.2
MCH: 30.2
MCHC: 33.9
MCV: 89 (ref 76–111)
Monocytes(Absolute): 0.8
Monocytes: 11
Neutro Abs: 3.6
Neutrophils: 54
RBC: 4.31 (ref 3.87–5.11)
RDW: 13.5
WBC: 6.7
platelet count: 251

## 2017-06-18 LAB — CMP 10231
ALT: 11 (ref 3–30)
AST: 22
Albumin/Globulin Ratio: 1.5
Albumin: 4.4
Alkaline Phosphatase: 97
BUN/Creatinine Ratio: 14
BUN: 12 (ref 4–21)
Calcium: 9.9
Carbon Dioxide, Total: 26
Chloride: 100
Creat: 0.83
EGFR (African American): 89
EGFR (Non-African Amer.): 77
Globulin, Total: 3
Glucose: 99
Potassium: 4.5
Sodium: 140
Total Bilirubin: 0.3
Total Protein: 7.4 (ref 6.4–8.2)

## 2017-06-18 LAB — LIPID PANEL
Cholesterol, Total: 252
HDL Cholesterol: 57 (ref 35–70)
LDL Cholesterol: 183
Triglycerides: 61 (ref 40–160)
VLDL Cholesterol Cal: 12

## 2017-06-23 DIAGNOSIS — Z0000 Encounter for general adult medical examination without abnormal findings: Secondary | ICD-10-CM | POA: Diagnosis not present

## 2017-08-09 ENCOUNTER — Encounter (INDEPENDENT_AMBULATORY_CARE_PROVIDER_SITE_OTHER): Payer: Self-pay | Admitting: Internal Medicine

## 2017-08-09 ENCOUNTER — Ambulatory Visit (INDEPENDENT_AMBULATORY_CARE_PROVIDER_SITE_OTHER): Payer: 59 | Admitting: Internal Medicine

## 2017-08-09 VITALS — BP 114/68 | HR 70 | Temp 98.1°F | Resp 18 | Ht 72.0 in | Wt 192.0 lb

## 2017-08-09 DIAGNOSIS — K219 Gastro-esophageal reflux disease without esophagitis: Secondary | ICD-10-CM

## 2017-08-09 MED ORDER — OMEPRAZOLE 20 MG PO CPDR: 20.0000 mg | DELAYED_RELEASE_CAPSULE | Freq: Every day | ORAL | 3 refills | Status: DC

## 2017-08-09 NOTE — Progress Notes (Signed)
Presenting complaint;  Follow-up for chronic GERD.  Subjective:  Elizabeth Lopez is 62-year-old Afro-American female who is here for scheduled visit.  She was last seen in November 2017.  As well as GERD symptoms are concerned she is doing well with omeprazole 40 mg daily.  She rarely has heartburn regurgitation.  She also denies dysphagia.  She states she is watching her diet closely. She states her bowels move like clockwork if she eats walnuts peanuts etc. and if she quits doing so she may skip a day.  If she becomes constipated she may notice a hemorrhoid but it usually shrinks.  She exercises regularly. Last colonoscopy was in December 2017 and was normal other than external hemorrhoids.  Colonoscopy in December 2014 revealed a hyperplastic polyp.   Current Medications: Outpatient Encounter Medications as of 08/09/2017  Medication Sig  . ALPRAZolam (XANAX) 0.5 MG tablet Take 0.5 mg by mouth 4 (four) times daily as needed for anxiety.  . Cholecalciferol (VITAMIN D PO) Take 1 capsule by mouth daily.   . COD LIVER OIL PO Take by mouth daily.  Marland Kitchen levothyroxine (SYNTHROID, LEVOTHROID) 150 MCG tablet Take 1 tablet (150 mcg total) by mouth daily before breakfast. (Patient taking differently: Take 125 mcg by mouth daily before breakfast. )  . omeprazole (PRILOSEC) 40 MG capsule TAKE ONE CAPSULE BY MOUTH DAILY  . vitamin B-12 (CYANOCOBALAMIN) 500 MCG tablet Take 500 mcg by mouth daily.    . [DISCONTINUED] naproxen sodium (ANAPROX) 220 MG tablet Take 440 mg by mouth daily as needed (headaches).  . [DISCONTINUED] vitamin C (ASCORBIC ACID) 500 MG tablet Take 500 mg by mouth daily.     No facility-administered encounter medications on file as of 08/09/2017.      Objective: Blood pressure 114/68, pulse 70, temperature 98.1 F (36.7 C), temperature source Oral, resp. rate 18, height 6' (1.829 m), weight 192 lb (87.1 kg), last menstrual period 07/30/2016. Patient is alert and in no acute distress. Conjunctiva  is pink. Sclera is nonicteric Oropharyngeal mucosa is normal. No neck masses or thyromegaly noted. Cardiac exam with regular rhythm normal S1 and S2. No murmur or gallop noted. Lungs are clear to auscultation. Abdomen abdomen is symmetrical soft and nontender with organomegaly or masses. No LE edema or clubbing noted.    Assessment:  #1.  Chronic GERD.  She had a EGD in August 2015 revealing small sliding hiatal hernia and esophageal biopsy was negative for Barrett's.  She is doing well on current dose of omeprazole and is ready for dose reduction.  #2.  Patient is high risk for CRC because of history of colon cancer in her mother at age 15.  She is up-to-date on screening.  Next screening would be in December 2022.   Plan:  Decrease omeprazole to 20 mg p.o. daily before breakfast or evening meal. Can take Pepcid OTC or Zantac OTC 150 mg for breakthrough symptoms on as-needed basis. Patient will call office with progress report in 4 to 6 weeks. Office visit in 1 year.

## 2017-08-09 NOTE — Patient Instructions (Signed)
Take omeprazole 20 mg 30 minutes before breakfast or evening meal.  Can take Pepcid OTC 20 mg or Zantac OTC 150 mg for breakthrough symptoms on as-needed basis. Call office with progress report in 4 to 6 weeks.

## 2017-08-22 ENCOUNTER — Encounter (HOSPITAL_COMMUNITY): Payer: Self-pay

## 2017-08-22 ENCOUNTER — Other Ambulatory Visit (HOSPITAL_COMMUNITY): Payer: Self-pay | Admitting: Pulmonary Disease

## 2017-08-22 ENCOUNTER — Ambulatory Visit (HOSPITAL_COMMUNITY)
Admission: RE | Admit: 2017-08-22 | Discharge: 2017-08-22 | Disposition: A | Discharge status: Home / Self Care | Payer: 59 | Source: Ambulatory Visit | Attending: Pulmonary Disease | Admitting: Pulmonary Disease

## 2017-08-22 DIAGNOSIS — I700 Atherosclerosis of aorta: Secondary | ICD-10-CM | POA: Diagnosis not present

## 2017-08-22 DIAGNOSIS — R079 Chest pain, unspecified: Secondary | ICD-10-CM | POA: Diagnosis not present

## 2017-08-22 DIAGNOSIS — M79603 Pain in arm, unspecified: Secondary | ICD-10-CM | POA: Diagnosis not present

## 2017-08-22 DIAGNOSIS — M542 Cervicalgia: Secondary | ICD-10-CM | POA: Diagnosis not present

## 2017-08-22 DIAGNOSIS — R0902 Hypoxemia: Secondary | ICD-10-CM | POA: Diagnosis not present

## 2017-08-22 LAB — POCT I-STAT CREATININE: Creatinine, Ser: 0.7 mg/dL (ref 0.44–1.00)

## 2017-08-22 MED ORDER — IOPAMIDOL (ISOVUE-370) INJECTION 76%
100.0000 mL | Freq: Once | INTRAVENOUS | Status: AC | PRN
  Administered 2017-08-22: 100 mL via INTRAVENOUS

## 2017-11-07 ENCOUNTER — Other Ambulatory Visit (HOSPITAL_COMMUNITY): Payer: Self-pay | Admitting: Pulmonary Disease

## 2017-11-07 DIAGNOSIS — M542 Cervicalgia: Secondary | ICD-10-CM

## 2017-11-07 DIAGNOSIS — M545 Low back pain, unspecified: Secondary | ICD-10-CM

## 2017-11-10 ENCOUNTER — Encounter (HOSPITAL_COMMUNITY): Payer: Self-pay

## 2017-11-10 ENCOUNTER — Ambulatory Visit (HOSPITAL_COMMUNITY)
Admission: RE | Admit: 2017-11-10 | Discharge: 2017-11-10 | Disposition: A | Discharge status: Home / Self Care | Payer: 59 | Source: Ambulatory Visit | Attending: Pulmonary Disease | Admitting: Pulmonary Disease

## 2017-11-10 DIAGNOSIS — M47812 Spondylosis without myelopathy or radiculopathy, cervical region: Secondary | ICD-10-CM | POA: Diagnosis not present

## 2017-11-10 DIAGNOSIS — M542 Cervicalgia: Secondary | ICD-10-CM | POA: Diagnosis not present

## 2017-11-10 DIAGNOSIS — M50221 Other cervical disc displacement at C4-C5 level: Secondary | ICD-10-CM | POA: Diagnosis not present

## 2017-11-10 DIAGNOSIS — M545 Low back pain, unspecified: Secondary | ICD-10-CM

## 2017-11-14 ENCOUNTER — Other Ambulatory Visit: Payer: Self-pay | Admitting: Pulmonary Disease

## 2017-11-14 DIAGNOSIS — M542 Cervicalgia: Secondary | ICD-10-CM

## 2017-11-29 ENCOUNTER — Ambulatory Visit
Admission: RE | Admit: 2017-11-29 | Discharge: 2017-11-29 | Disposition: A | Discharge status: Home / Self Care | Payer: 59 | Source: Ambulatory Visit | Attending: Pulmonary Disease | Admitting: Pulmonary Disease

## 2017-11-29 DIAGNOSIS — M542 Cervicalgia: Secondary | ICD-10-CM

## 2017-11-29 DIAGNOSIS — M50222 Other cervical disc displacement at C5-C6 level: Secondary | ICD-10-CM | POA: Diagnosis not present

## 2017-11-29 MED ORDER — IOPAMIDOL (ISOVUE-M 300) INJECTION 61%
1.0000 mL | Freq: Once | INTRAMUSCULAR | Status: AC | PRN
  Administered 2017-11-29: 1 mL via EPIDURAL

## 2017-11-29 MED ORDER — TRIAMCINOLONE ACETONIDE 40 MG/ML IJ SUSP (RADIOLOGY)
60.0000 mg | Freq: Once | INTRAMUSCULAR | Status: AC
  Administered 2017-11-29: 60 mg via EPIDURAL

## 2017-11-29 NOTE — Discharge Instructions (Signed)

## 2017-12-20 DIAGNOSIS — Z1231 Encounter for screening mammogram for malignant neoplasm of breast: Secondary | ICD-10-CM | POA: Diagnosis not present

## 2017-12-20 LAB — HM MAMMOGRAPHY

## 2018-01-05 DIAGNOSIS — R928 Other abnormal and inconclusive findings on diagnostic imaging of breast: Secondary | ICD-10-CM | POA: Diagnosis not present

## 2018-01-05 DIAGNOSIS — R922 Inconclusive mammogram: Secondary | ICD-10-CM | POA: Diagnosis not present

## 2018-02-16 ENCOUNTER — Ambulatory Visit: Payer: 59 | Admitting: "Endocrinology

## 2018-02-16 DIAGNOSIS — Z01419 Encounter for gynecological examination (general) (routine) without abnormal findings: Secondary | ICD-10-CM | POA: Diagnosis not present

## 2018-03-04 ENCOUNTER — Other Ambulatory Visit: Payer: Self-pay | Admitting: "Endocrinology

## 2018-03-08 ENCOUNTER — Other Ambulatory Visit: Payer: Self-pay | Admitting: "Endocrinology

## 2018-03-08 DIAGNOSIS — E890 Postprocedural hypothyroidism: Secondary | ICD-10-CM | POA: Diagnosis not present

## 2018-03-09 LAB — TSH: TSH: 2.19 u[IU]/mL (ref 0.450–4.500)

## 2018-03-09 LAB — T4, FREE: FREE T4: 1.24 ng/dL (ref 0.82–1.77)

## 2018-03-15 ENCOUNTER — Ambulatory Visit: Payer: 59 | Admitting: "Endocrinology

## 2018-03-15 ENCOUNTER — Ambulatory Visit (INDEPENDENT_AMBULATORY_CARE_PROVIDER_SITE_OTHER): Payer: 59 | Admitting: "Endocrinology

## 2018-03-15 ENCOUNTER — Encounter: Payer: Self-pay | Admitting: "Endocrinology

## 2018-03-15 VITALS — BP 126/80 | HR 64 | Ht 72.0 in | Wt 204.0 lb

## 2018-03-15 DIAGNOSIS — E890 Postprocedural hypothyroidism: Secondary | ICD-10-CM

## 2018-03-15 MED ORDER — LEVOTHYROXINE SODIUM 150 MCG PO TABS: ORAL_TABLET | ORAL | 3 refills | Status: DC

## 2018-03-15 NOTE — Progress Notes (Signed)
Endocrinology follow-up note   Subjective:    Patient ID: Elizabeth Lopez, female    DOB: 02/05/1956, PCP Sinda Du, MD   Past Medical History:  Diagnosis Date  . Anxiety   . Chronic constipation   . Family hx of colon cancer   . GERD (gastroesophageal reflux disease)   . Hyperlipidemia   . Hypothyroidism    Past Surgical History:  Procedure Laterality Date  . COLONOSCOPY    . COLONOSCOPY  02/12/2011   Procedure: COLONOSCOPY;  Surgeon: Rogene Houston, MD;  [facility];  Service: Endoscopy;  Laterality: N/A;  9:45  . COLONOSCOPY N/A 02/12/2016   Procedure: COLONOSCOPY;  Surgeon: Rogene Houston, MD;  [facility];  Service: Endoscopy;  Laterality: N/A;  730  . ESOPHAGOGASTRODUODENOSCOPY N/A 10/05/2013   Procedure: ESOPHAGOGASTRODUODENOSCOPY (EGD);  Surgeon: Rogene Houston, MD;  [facility];  Service: Endoscopy;  Laterality: N/A;  1200-rescheduled to [place] notified pt  . knot     patient had a knot removed from right wrisit 9 years ago.  Marland Kitchen LASIK    . WRIST GANGLION EXCISION     right   Social History   Socioeconomic History  . Marital status: Married    Spouse name: Not on file  . Number of children: Not on file  . Years of education: Not on file  . Highest education level: Not on file  Occupational History  . Not on file  Social Needs  . Financial resource strain: Not on file  . Food insecurity:    Worry: Not on file    Inability: Not on file  . Transportation needs:    Medical: Not on file    Non-medical: Not on file  Tobacco Use  . Smoking status: Never Smoker  . Smokeless tobacco: Never Used  Substance and Sexual Activity  . Alcohol use: Yes    Comment: occasional wine  . Drug use: No  . Sexual activity: Not on file  Lifestyle  . Physical activity:    Days per week: Not on file    Minutes per session: Not on file  . Stress: Not on file  Relationships  . Social connections:    Talks on phone: Not on file     Gets together: Not on file    Attends religious service: Not on file    Active member of club or organization: Not on file    Attends meetings of clubs or organizations: Not on file    Relationship status: Not on file  Other Topics Concern  . Not on file  Social History Narrative  . Not on file   Outpatient Encounter Medications as of 03/15/2018  Medication Sig  . ALPRAZolam (XANAX) 0.5 MG tablet Take 0.5 mg by mouth 4 (four) times daily as needed for anxiety.  . Cholecalciferol (VITAMIN D PO) Take 1 capsule by mouth daily.   . COD LIVER OIL PO Take by mouth daily.  Marland Kitchen levothyroxine (SYNTHROID, LEVOTHROID) 150 MCG tablet TAKE ONE TABLET (150MCG TOTAL) BY MOUTH DAILY BEFORE BREAKFAST  . omeprazole (PRILOSEC) 20 MG capsule Take 1 capsule (20 mg total) by mouth daily before breakfast.  . vitamin B-12 (CYANOCOBALAMIN) 500 MCG tablet Take 500 mcg by mouth daily.    . [DISCONTINUED] levothyroxine (SYNTHROID, LEVOTHROID) 150 MCG tablet TAKE ONE TABLET (150MCG TOTAL) BY MOUTH DAILY BEFORE BREAKFAST   No facility-administered encounter medications on file as of 03/15/2018.    ALLERGIES: No Known  Allergies VACCINATION STATUS:  There is no immunization history on file for this patient.  HPI  63 yo female with with Graves disease status po I131 therapy on Aril 2012. She is on Synthroid 150 mcg p.o. nightly.  She reports compliance with medication.   she has no new complaints today, except that she cannot lose weight.  She came with  thyroid function test. she denies cold intolerance, heat intolerance, and palpitation.    Review of Systems  Constitutional: + Progress weight gain, no fatigue, no subjective hyperthermia/hypothermia Eyes: no blurry vision, no xerophthalmia ENT: no sore throat, no nodules palpated in throat, no dysphagia/odynophagia, no hoarseness Musculoskeletal: no muscle/joint aches Skin: no rashes Neurological: no tremors/numbness/tingling/dizziness Psychiatric: no  depression/anxiety   Objective:    BP 126/80   Pulse 64   Ht 6' (1.829 m)   Wt 204 lb (92.5 kg)   BMI 27.67 kg/m   Wt Readings from Last 3 Encounters:  03/15/18 204 lb (92.5 kg)  08/09/17 192 lb (87.1 kg)  02/24/17 201 lb (91.2 kg)    Physical Exam  Constitutional: Not in acute distress Eyes: PERRLA, EOMI, no exophthalmos ENT: moist mucous membranes, no thyromegaly, no cervical lymphadenopathy Skin: moist, warm, no rashes Neurological: no tremor with outstretched hands, DTR normal in all 4  Results for orders placed or performed in visit on 03/08/18  T4, Free  Result Value Ref Range   Free T4 1.24 0.82 - 1.77 ng/dL  TSH  Result Value Ref Range   TSH 2.190 0.450 - 4.500 uIU/mL   Complete Blood Count (Most recent): Lab Results  Component Value Date   HGB 11.8 (A) 06/16/2010   HCT 37 06/16/2010   PLT 279 06/16/2010   Chemistry (most recent): Lab Results  Component Value Date   NA 138 06/16/2010   K 4.5 06/16/2010   CL 103 06/10/2010   BUN 10 06/10/2010   CREATININE 0.70 08/22/2017      Assessment & Plan:    Hypothyroidism due to RAI   - Her visit thyroid function tests are consistent with appropriate replacement.   -She is advised to continue levothyroxine 150 g by mouth every morning.  - We discussed about correct intake of levothyroxine, at fasting, with water, separated by at least 30 minutes from breakfast, and separated by more than 4 hours from calcium, iron, multivitamins, acid reflux medications (PPIs). -Patient is made aware of the fact that thyroid hormone replacement is needed for life, dose to be adjusted by periodic monitoring of thyroid function tests. - I advised patient to maintain close follow up with Sinda Du, MD for primary care needs. Follow up plan: Return in about 1 year (around 03/16/2019) for Follow up with Pre-visit Labs.  Glade Lloyd, MD Phone: [phone]  Fax: [phone]  -  This note was partially dictated with voice  recognition software. Similar sounding words can be transcribed inadequately or may not  be corrected upon review.  03/15/2018, 4:25 PM

## 2018-03-16 ENCOUNTER — Ambulatory Visit: Payer: 59 | Admitting: "Endocrinology

## 2018-06-05 DIAGNOSIS — R202 Paresthesia of skin: Secondary | ICD-10-CM | POA: Diagnosis not present

## 2018-08-15 ENCOUNTER — Ambulatory Visit (INDEPENDENT_AMBULATORY_CARE_PROVIDER_SITE_OTHER): Payer: 59 | Admitting: Internal Medicine

## 2018-11-04 ENCOUNTER — Other Ambulatory Visit (INDEPENDENT_AMBULATORY_CARE_PROVIDER_SITE_OTHER): Payer: Self-pay | Admitting: Internal Medicine

## 2018-11-07 NOTE — Telephone Encounter (Signed)
Patient was last seen 2019. She will need have OV prior to further refills. We are refilling for 3 months.

## 2018-11-10 LAB — LIPID PANEL
Cholesterol, Total: 260
HDL Cholesterol: 52 (ref 35–70)
LDL Cholesterol: 199
Triglycerides: 60 (ref 40–160)
VLDL Cholesterol Cal: 9

## 2018-11-11 LAB — CMP 10231
ALT: 11 (ref 3–30)
AST: 20
Albumin/Globulin Ratio: 1.4
Albumin: 4.5
Alkaline Phosphatase: 97
BUN/Creatinine Ratio: 17
BUN: 14 (ref 4–21)
Calcium: 10.2
Carbon Dioxide, Total: 25
Chloride: 99
Creat: 0.83
EGFR (African American): 88
EGFR (Non-African Amer.): 76
Globulin, Total: 3.2
Glucose: 92
Potassium: 4.3
Sodium: 135
Total Bilirubin: 0.3
Total Protein: 7.7 (ref 6.4–8.2)

## 2018-11-11 LAB — CBC WITH DIFF/PLATELET
BASO(ABSOLUTE): 0
Basophils: 0
Eosinophils Absolute: 0
Eosinophils, %: 2
HCT: 42 — AB (ref 29–41)
Hemoglobin: 13.6
Immature Granulocytes: 0
LYMPH: 37 %
Lymphs Abs: 2.6
MCH: 28.9
MCHC: 32.1
MCV: 90 (ref 76–111)
Monocytes(Absolute): 0.7
Monocytes: 10
Neutro Abs: 3.6
Neutrophils: 51
RBC: 4.71 (ref 3.87–5.11)
RDW: 12.9
WBC: 7.1
platelet count: 266

## 2018-11-11 LAB — TSH: TSH: 0.599

## 2019-01-22 ENCOUNTER — Telehealth: Payer: Self-pay | Admitting: "Endocrinology

## 2019-01-22 DIAGNOSIS — E7800 Pure hypercholesterolemia, unspecified: Secondary | ICD-10-CM

## 2019-01-22 NOTE — Telephone Encounter (Signed)
Patient would like to know could you add a lipid to her lab order? Dr Luan Pulling is retiring and he put her on cholesterol medication about 2 months ago.

## 2019-01-22 NOTE — Telephone Encounter (Signed)
Yes, we can add lipid panel to her labs.

## 2019-01-22 NOTE — Telephone Encounter (Signed)
Order printed

## 2019-02-07 ENCOUNTER — Telehealth (INDEPENDENT_AMBULATORY_CARE_PROVIDER_SITE_OTHER): Payer: Self-pay | Admitting: Internal Medicine

## 2019-02-07 NOTE — Telephone Encounter (Signed)
Patient called left voice mail message - stated she needs a refill (didn't say which medication) she has an appointment with NUR on 12/15  -  Please advise - ph# [phone]

## 2019-02-13 ENCOUNTER — Ambulatory Visit (INDEPENDENT_AMBULATORY_CARE_PROVIDER_SITE_OTHER): Payer: 59 | Admitting: Internal Medicine

## 2019-02-21 ENCOUNTER — Other Ambulatory Visit: Payer: Self-pay | Admitting: "Endocrinology

## 2019-02-22 LAB — LIPID PANEL W/O CHOL/HDL RATIO
Cholesterol, Total: 185 mg/dL (ref 100–199)
HDL: 61 mg/dL (ref >39)
LDL Chol Calc (NIH): 113 mg/dL — ABNORMAL HIGH (ref 0–99)
Triglycerides: 57 mg/dL (ref 0–149)
VLDL Cholesterol Cal: 11 mg/dL (ref 5–40)

## 2019-02-22 LAB — SPECIMEN STATUS REPORT

## 2019-02-22 LAB — TSH: TSH: 0.544 u[IU]/mL (ref 0.450–4.500)

## 2019-02-22 LAB — T4, FREE: Free T4: 1.67 ng/dL (ref 0.82–1.77)

## 2019-02-27 ENCOUNTER — Encounter: Payer: Self-pay | Admitting: "Endocrinology

## 2019-02-27 ENCOUNTER — Ambulatory Visit (INDEPENDENT_AMBULATORY_CARE_PROVIDER_SITE_OTHER): Payer: 59 | Admitting: "Endocrinology

## 2019-02-27 DIAGNOSIS — E890 Postprocedural hypothyroidism: Secondary | ICD-10-CM

## 2019-02-27 DIAGNOSIS — E782 Mixed hyperlipidemia: Secondary | ICD-10-CM | POA: Diagnosis not present

## 2019-02-27 MED ORDER — ROSUVASTATIN CALCIUM 5 MG PO TABS: 5.0000 mg | ORAL_TABLET | Freq: Every day | ORAL | 3 refills | Status: DC

## 2019-02-27 MED ORDER — LEVOTHYROXINE SODIUM 150 MCG PO TABS: ORAL_TABLET | ORAL | 3 refills | Status: DC

## 2019-02-27 NOTE — Progress Notes (Signed)
02/27/2019                                Endocrinology Telehealth Visit Follow up Note -During COVID -19 Pandemic  I connected with Elizabeth Lopez on 02/27/2019   by telephone and verified that I am speaking with the correct person using two identifiers. Elizabeth Lopez, Jul 18, 1955. she has verbally consented to this visit. All issues noted in this document were discussed and addressed. The format was not optimal for physical exam.    Subjective:    Patient ID: Elizabeth Lopez, female    DOB: 1955-04-08, PCP Sinda Du, MD   Past Medical History:  Diagnosis Date  . Anxiety   . Chronic constipation   . Family hx of colon cancer   . GERD (gastroesophageal reflux disease)   . Hyperlipidemia   . Hypothyroidism    Past Surgical History:  Procedure Laterality Date  . COLONOSCOPY    . COLONOSCOPY  02/12/2011   Procedure: COLONOSCOPY;  Surgeon: Rogene Houston, MD;  [facility];  Service: Endoscopy;  Laterality: N/A;  9:45  . COLONOSCOPY N/A 02/12/2016   Procedure: COLONOSCOPY;  Surgeon: Rogene Houston, MD;  [facility];  Service: Endoscopy;  Laterality: N/A;  730  . ESOPHAGOGASTRODUODENOSCOPY N/A 10/05/2013   Procedure: ESOPHAGOGASTRODUODENOSCOPY (EGD);  Surgeon: Rogene Houston, MD;  [facility];  Service: Endoscopy;  Laterality: N/A;  1200-rescheduled to [place] notified pt  . knot     patient had a knot removed from right wrisit 9 years ago.  Marland Kitchen LASIK    . WRIST GANGLION EXCISION     right   Social History   Socioeconomic History  . Marital status: Married    Spouse name: Not on file  . Number of children: Not on file  . Years of education: Not on file  . Highest education level: Not on file  Occupational History  . Not on file  Tobacco Use  . Smoking status: Never Smoker  . Smokeless tobacco: Never Used  Substance and Sexual Activity  . Alcohol use: Yes    Comment: occasional wine  . Drug use: No  . Sexual activity: Not on  file  Other Topics Concern  . Not on file  Social History Narrative  . Not on file   Social Determinants of Health   Financial Resource Strain:   . Difficulty of Paying Living Expenses: Not on file  Food Insecurity:   . Worried About Charity fundraiser in the Last Year: Not on file  . Ran Out of Food in the Last Year: Not on file  Transportation Needs:   . Lack of Transportation (Medical): Not on file  . Lack of Transportation (Non-Medical): Not on file  Physical Activity:   . Days of Exercise per Week: Not on file  . Minutes of Exercise per Session: Not on file  Stress:   . Feeling of Stress : Not on file  Social Connections:   . Frequency of Communication with Friends and Family: Not on file  . Frequency of Social Gatherings with Friends and Family: Not on file  . Attends Religious Services: Not on file  . Active Member of Clubs or Organizations: Not on file  . Attends Archivist Meetings: Not on file  . Marital Status: Not on file   Outpatient Encounter Medications as of 02/27/2019  Medication Sig  .  rosuvastatin (CRESTOR) 5 MG tablet Take 1 tablet (5 mg total) by mouth daily.  . [DISCONTINUED] rosuvastatin (CRESTOR) 5 MG tablet Take 5 mg by mouth daily.  Marland Kitchen ALPRAZolam (XANAX) 0.5 MG tablet Take 0.5 mg by mouth 4 (four) times daily as needed for anxiety.  . Cholecalciferol (VITAMIN D PO) Take 1 capsule by mouth daily.   . COD LIVER OIL PO Take by mouth daily.  Marland Kitchen levothyroxine (SYNTHROID) 150 MCG tablet TAKE ONE TABLET (150MCG TOTAL) BY MOUTH DAILY BEFORE BREAKFAST  . omeprazole (PRILOSEC) 20 MG capsule TAKE ONE CAPSULE (20MG  TOTAL) BY MOUTH DAILY BEFORE BREAKFAST  . vitamin B-12 (CYANOCOBALAMIN) 500 MCG tablet Take 500 mcg by mouth daily.    . [DISCONTINUED] levothyroxine (SYNTHROID, LEVOTHROID) 150 MCG tablet TAKE ONE TABLET (150MCG TOTAL) BY MOUTH DAILY BEFORE BREAKFAST   No facility-administered encounter medications on file as of 02/27/2019.    ALLERGIES: No Known Allergies VACCINATION STATUS:  There is no immunization history on file for this patient.  HPI  63 yo female with with Graves disease status po I131 therapy on Aril 2012. She is on Synthroid 150 mcg p.o. daily before breakfast.  She is here for her annual follow-up.  She has no new complaints today.  However previsit thyroid function tests are consistent with appropriate replacement.  She was recently started on Crestor for hyperlipidemia.  she denies cold intolerance, heat intolerance, and palpitation.    Review of Systems  Limited as above.  Objective:    There were no vitals taken for this visit.  Wt Readings from Last 3 Encounters:  03/15/18 204 lb (92.5 kg)  08/09/17 192 lb (87.1 kg)  02/24/17 201 lb (91.2 kg)    Physical Exam   Results for orders placed or performed in visit on 02/21/19  Lipid Panel w/o Chol/HDL Ratio  Result Value Ref Range   Cholesterol, Total 185 100 - 199 mg/dL   Triglycerides 57 0 - 149 mg/dL   HDL 61 >39 mg/dL   VLDL Cholesterol Cal 11 5 - 40 mg/dL   LDL Chol Calc (NIH) 113 (H) 0 - 99 mg/dL  Specimen status report  Result Value Ref Range   specimen status report Comment    Complete Blood Count (Most recent): Lab Results  Component Value Date   HGB 11.8 (A) 06/16/2010   HCT 37 06/16/2010   PLT 279 06/16/2010   Chemistry (most recent): Lab Results  Component Value Date   NA 138 06/16/2010   K 4.5 06/16/2010   CL 103 06/10/2010   BUN 10 06/10/2010   CREATININE 0.70 08/22/2017      Assessment & Plan:    Hypothyroidism due to RAI   - Her visit thyroid function tests are consistent with appropriate replacement.   -She is advised to continue  levothyroxine 150 g by mouth every morning.  - We discussed about the correct intake of her thyroid hormone, on empty stomach at fasting, with water, separated by at least 30 minutes from breakfast and other medications,  and separated by more than 4 hours from calcium,  iron, multivitamins, acid reflux medications (PPIs). -Patient is made aware of the fact that thyroid hormone replacement is needed for life, dose to be adjusted by periodic monitoring of thyroid function tests.  Hyperlipidemia: She was started on Crestor 5 mg p.o. nightly by Dr. Luan Pulling.  Her most recent LDL was 113 and improving.  She will have repeat fasting lipid panel before her next visit. - I advised patient to  maintain close follow up with Sinda Du, MD for primary care needs.   Time for this visit: 15 minutes. Elizabeth Lopez  participated in the discussions, expressed understanding, and voiced agreement with the above plans.  All questions were answered to her satisfaction. she is encouraged to contact clinic should she have any questions or concerns prior to her return visit.  Follow up plan: Return in about 6 months (around 08/28/2019) for Follow up with Pre-visit Labs.  Glade Lloyd, MD Phone: [phone]  Fax: [phone]  -  This note was partially dictated with voice recognition software. Similar sounding words can be transcribed inadequately or may not  be corrected upon review.  02/27/2019, 3:37 PM

## 2019-04-19 ENCOUNTER — Ambulatory Visit: Payer: 59

## 2019-04-23 ENCOUNTER — Ambulatory Visit: Payer: 59 | Attending: Family

## 2019-04-23 DIAGNOSIS — Z23 Encounter for immunization: Secondary | ICD-10-CM | POA: Insufficient documentation

## 2019-04-23 NOTE — Progress Notes (Signed)
   Covid-19 Vaccination Clinic  Name:  Elizabeth Lopez    MRN: [id number] DOB: [birth year]  04/23/2019  Elizabeth Lopez was observed post Covid-19 immunization for 15 minutes without incidence. She was provided with Vaccine Information Sheet and instruction to access the V-Safe system.   Elizabeth Lopez was instructed to call 911 with any severe reactions post vaccine: Marland Kitchen Difficulty breathing  . Swelling of your face and throat  . A fast heartbeat  . A bad rash all over your body  . Dizziness and weakness    Immunizations Administered    Name Date Dose VIS Date Route   Moderna COVID-19 Vaccine 04/23/2019  9:36 AM 0.5 mL 01/30/2019 Intramuscular   Manufacturer: Moderna   Lot: [id number]   [id number]

## 2019-05-22 ENCOUNTER — Ambulatory Visit: Payer: 59 | Attending: Family

## 2019-05-22 DIAGNOSIS — Z23 Encounter for immunization: Secondary | ICD-10-CM

## 2019-05-22 NOTE — Progress Notes (Signed)
   Covid-19 Vaccination Clinic  Name:  Elizabeth Lopez    MRN: [id number] DOB: 11-25-1955  05/22/2019  [name] was observed post Covid-19 immunization for 15 minutes without incident. She was provided with Vaccine Information Sheet and instruction to access the V-Safe system.   [name] was instructed to call 911 with any severe reactions post vaccine: Marland Kitchen Difficulty breathing  . Swelling of face and throat  . A fast heartbeat  . A bad rash all over body  . Dizziness and weakness   Immunizations Administered    Name Date Dose VIS Date Route   Moderna COVID-19 Vaccine 05/22/2019  4:21 PM 0.5 mL 01/30/2019 Intramuscular   Manufacturer: Levan Hurst   Lot: [id number]   [id number]

## 2019-06-12 ENCOUNTER — Ambulatory Visit (INDEPENDENT_AMBULATORY_CARE_PROVIDER_SITE_OTHER): Payer: 59 | Admitting: Internal Medicine

## 2019-06-28 ENCOUNTER — Ambulatory Visit: Payer: 59 | Admitting: Family Medicine

## 2019-08-18 ENCOUNTER — Other Ambulatory Visit: Payer: Self-pay

## 2019-08-18 ENCOUNTER — Ambulatory Visit
Admission: EM | Admit: 2019-08-18 | Discharge: 2019-08-18 | Disposition: A | Discharge status: Home / Self Care | Payer: 59 | Attending: Emergency Medicine | Admitting: Emergency Medicine

## 2019-08-18 ENCOUNTER — Encounter: Payer: Self-pay | Admitting: Emergency Medicine

## 2019-08-18 DIAGNOSIS — W57XXXA Bitten or stung by nonvenomous insect and other nonvenomous arthropods, initial encounter: Secondary | ICD-10-CM

## 2019-08-18 DIAGNOSIS — R21 Rash and other nonspecific skin eruption: Secondary | ICD-10-CM

## 2019-08-18 MED ORDER — DOXYCYCLINE HYCLATE 100 MG PO CAPS: 200.0000 mg | ORAL_CAPSULE | Freq: Once | ORAL | 0 refills | Status: AC

## 2019-08-18 MED ORDER — TRIAMCINOLONE ACETONIDE 0.1 % EX CREA: 1.0000 "application " | TOPICAL_CREAM | Freq: Two times a day (BID) | CUTANEOUS | 0 refills | Status: DC

## 2019-08-18 NOTE — ED Provider Notes (Signed)
[facility]   [id number] 08/18/19 Arrival Time: 1118   Chief Complaint  Patient presents with   Insect Bite     SUBJECTIVE: History from: patient.  Elizabeth Lopez is a 64 y.o. female resented to the urgent care for complaint of tick bite that occurred 3 days ago.  Denies a precipitating event, noticed while changing clothes.  Localizes the bite to her right leg behind the knee.  Has removed to take.  Denies previous hx of tick bite.  Denies fever, chills, nausea, vomiting, headache, dizziness, weakness, fatigue, rash, or abdominal pain.    ROS: As per HPI.  All other pertinent ROS negative.     Past Medical History:  Diagnosis Date   Anxiety    Chronic constipation    Family hx of colon cancer    GERD (gastroesophageal reflux disease)    Hyperlipidemia    Hypothyroidism    Past Surgical History:  Procedure Laterality Date   COLONOSCOPY     COLONOSCOPY  02/12/2011   Procedure: COLONOSCOPY;  Surgeon: Rogene Houston, MD;  [facility];  Service: Endoscopy;  Laterality: N/A;  9:45   COLONOSCOPY N/A 02/12/2016   Procedure: COLONOSCOPY;  Surgeon: Rogene Houston, MD;  [facility];  Service: Endoscopy;  Laterality: N/A;  730   ESOPHAGOGASTRODUODENOSCOPY N/A 10/05/2013   Procedure: ESOPHAGOGASTRODUODENOSCOPY (EGD);  Surgeon: Rogene Houston, MD;  [facility];  Service: Endoscopy;  Laterality: N/A;  1200-rescheduled to [place] notified pt   knot     patient had a knot removed from right wrisit 9 years ago.   LASIK     WRIST GANGLION EXCISION     right   No Known Allergies No current facility-administered medications on file prior to encounter.   Current Outpatient Medications on File Prior to Encounter  Medication Sig Dispense Refill   ALPRAZolam (XANAX) 0.5 MG tablet Take 0.5 mg by mouth 4 (four) times daily as needed for anxiety.     Cholecalciferol (VITAMIN D PO) Take 1 capsule by mouth daily.      COD LIVER  OIL PO Take by mouth daily.     levothyroxine (SYNTHROID) 150 MCG tablet TAKE ONE TABLET (150MCG TOTAL) BY MOUTH DAILY BEFORE BREAKFAST 90 tablet 3   omeprazole (PRILOSEC) 20 MG capsule TAKE ONE CAPSULE (20MG  TOTAL) BY MOUTH DAILY BEFORE BREAKFAST 90 capsule 3   rosuvastatin (CRESTOR) 5 MG tablet Take 1 tablet (5 mg total) by mouth daily. 90 tablet 3   vitamin B-12 (CYANOCOBALAMIN) 500 MCG tablet Take 500 mcg by mouth daily.       Social History   Socioeconomic History   Marital status: Married    Spouse name: Not on file   Number of children: Not on file   Years of education: Not on file   Highest education level: Not on file  Occupational History   Not on file  Tobacco Use   Smoking status: Never Smoker   Smokeless tobacco: Never Used  Vaping Use   Vaping Use: Never used  Substance and Sexual Activity   Alcohol use: Yes    Comment: occasional wine   Drug use: No   Sexual activity: Not on file  Other Topics Concern   Not on file  Social History Narrative   Not on file   Social Determinants of Health   Financial Resource Strain:    Difficulty of Paying Living Expenses:   Food Insecurity:    Worried About Running  Out of Food in the Last Year:    Ran Out of Food in the Last Year:   Transportation Needs:    Film/video editor (Medical):    Lack of Transportation (Non-Medical):   Physical Activity:    Days of Exercise per Week:    Minutes of Exercise per Session:   Stress:    Feeling of Stress :   Social Connections:    Frequency of Communication with Friends and Family:    Frequency of Social Gatherings with Friends and Family:    Attends Religious Services:    Active Member of Clubs or Organizations:    Attends Music therapist:    Marital Status:   Intimate Partner Violence:    Fear of Current or Ex-Partner:    Emotionally Abused:    Physically Abused:    Sexually Abused:    Family History  Problem Relation  Age of Onset   Colon cancer Mother    Cancer Mother    Healthy Sister    Healthy Brother    Healthy Sister    Healthy Sister    Healthy Brother    Healthy Brother    Cancer Father     OBJECTIVE:  Vitals:   08/18/19 1149 08/18/19 1155  BP: (!) 143/87   Pulse: 79   Resp: 16   Temp: 98.2 F (36.8 C)   TempSrc: Oral   SpO2: 97%   Weight:  185 lb (83.9 kg)  Height:  6' (1.829 m)     Physical Exam Vitals and nursing note reviewed.  Constitutional:      General: She is not in acute distress.    Appearance: Normal appearance. She is normal weight. She is not ill-appearing, toxic-appearing or diaphoretic.  HENT:     Nose: Nose normal. No congestion.  Cardiovascular:     Rate and Rhythm: Normal rate and regular rhythm.     Pulses: Normal pulses.     Heart sounds: Normal heart sounds. No murmur heard.  No friction rub. No gallop.   Pulmonary:     Effort: Pulmonary effort is normal. No respiratory distress.     Breath sounds: Normal breath sounds. No stridor. No wheezing, rhonchi or rales.  Chest:     Chest wall: No tenderness.  Skin:    General: Skin is warm.     Findings: Erythema and rash present. Rash is macular.  Neurological:     Mental Status: She is alert.      LABS:  No results found for this or any previous visit (from the past 24 hour(s)).   ASSESSMENT & PLAN:  1. Rash   2. Tick bite, initial encounter     Meds ordered this encounter  Medications   doxycycline (VIBRAMYCIN) 100 MG capsule    Sig: Take 2 capsules (200 mg total) by mouth once for 1 dose.    Dispense:  2 capsule    Refill:  0   triamcinolone cream (KENALOG) 0.1 %    Sig: Apply 1 application topically 2 (two) times daily.    Dispense:  30 g    Refill:  0    Discharge Instructions  Prescribed single prophylactic dose of doxycycline 200 mg To prevent tick bites, wear long sleeves, long pants, and light colors. Use DEET  insect repellent. Follow the instructions on the  bottle. If the tick is biting, do not try to remove it with heat, alcohol, petroleum jelly, or fingernail polish. Use tweezers, curved forceps, or a  tick-removal tool to grasp the tick. Gently pull up until the tick lets go. Do not twist or jerk the tick. Do not squeeze or crush the tick. Return here or go to ER if you have any new or worsening symptoms (rash, nausea, vomiting, fever, chills, headache, fatigue  Reviewed expectations re: course of current medical issues. Questions answered. Outlined signs and symptoms indicating need for more acute intervention. Patient verbalized understanding. After Visit Summary given.   Note: This document was prepared using Dragon voice recognition software and may include unintentional dictation errors.        Emerson Monte, [place] 08/18/19 1252

## 2019-08-18 NOTE — Discharge Instructions (Addendum)
Prescribed single prophylactic dose of doxycycline 200 mg To prevent tick bites, wear long sleeves, long pants, and light colors. Use DEET  insect repellent. Follow the instructions on the bottle. If the tick is biting, do not try to remove it with heat, alcohol, petroleum jelly, or fingernail polish. Use tweezers, curved forceps, or a tick-removal tool to grasp the tick. Gently pull up until the tick lets go. Do not twist or jerk the tick. Do not squeeze or crush the tick. Return here or go to ER if you have any new or worsening symptoms (rash, nausea, vomiting, fever, chills, headache, fatigue

## 2019-08-18 NOTE — ED Triage Notes (Signed)
Tick bite to RT upper leg behind knee that happened Tuesday.  Area is red and swollen. Pt has been taking some doxycycline that she had at her house since the incident.

## 2019-08-30 ENCOUNTER — Ambulatory Visit: Payer: 59 | Admitting: "Endocrinology

## 2019-09-11 ENCOUNTER — Ambulatory Visit (INDEPENDENT_AMBULATORY_CARE_PROVIDER_SITE_OTHER): Payer: 59 | Admitting: Internal Medicine

## 2019-09-11 ENCOUNTER — Other Ambulatory Visit: Payer: Self-pay

## 2019-09-11 ENCOUNTER — Encounter (INDEPENDENT_AMBULATORY_CARE_PROVIDER_SITE_OTHER): Payer: Self-pay | Admitting: Internal Medicine

## 2019-09-11 VITALS — BP 124/79 | HR 64 | Temp 99.2°F | Ht 72.0 in | Wt 193.3 lb

## 2019-09-11 DIAGNOSIS — Z800 Family history of malignant neoplasm of digestive organs: Secondary | ICD-10-CM

## 2019-09-11 DIAGNOSIS — K219 Gastro-esophageal reflux disease without esophagitis: Secondary | ICD-10-CM | POA: Insufficient documentation

## 2019-09-11 MED ORDER — OMEPRAZOLE 20 MG PO CPDR: DELAYED_RELEASE_CAPSULE | ORAL | 3 refills | Status: DC

## 2019-09-11 NOTE — Patient Instructions (Signed)
Please notify if omeprazole stops working. Next colonoscopy due in December 2022. Please have Dr. Delphina Cahill review your last bone density and if he agrees he should have another one this year.

## 2019-09-11 NOTE — Progress Notes (Signed)
Presenting complaint;  Follow-up for chronic GERD.  Database and subjective:  Patient is 64 year old African-American female who has 6-year history of GERD who is here for scheduled visit.  She did undergo EGD back in August 2015 when she presented with 72-month history of heartburn and epigastric pain.  She has small sliding hiatal hernia.  Esophageal mucosa looked abnormal but biopsy was unremarkable.  She has been maintained on dietary measures and PPI. She also has been undergoing high rescreening colonoscopies every 5 years.  First colonoscopy was in November 2000 8-second colonoscopy was in December 2012 and last colonoscopy was in December 2017.  She had hyperplastic polyp but no adenomas. Patient was last seen in the office 2 years ago.  She states she is doing well.  She says omeprazole is working very well.  However if she forgets to take morning dose she experiences breakthrough symptoms within couple of hours.  She is not having any side effects.  She also denies hoarseness chronic cough sore throat or dysphagia.  She has good appetite.  She is watching her diet.  She says last year she managed to lose 20 pounds but she has gained 8 pounds back.  Her bowels generally move daily.  Once in a while she may experience constipation.  She denies melena or rectal bleeding. Patient states she has received both doses of Covid vaccine made by moderna. She also wants to know when she should have next colonoscopy. Her mother was diagnosed with colon carcinoma in her 51s and died of metastatic disease at age 27.  Father had gastric carcinoma in his 1s. Patient states that she had bone density study in March 2017 and she believes it was normal.   Current Medications: Outpatient Encounter Medications as of 09/11/2019  Medication Sig  . ALPRAZolam (XANAX) 0.5 MG tablet Take 0.5 mg by mouth 4 (four) times daily as needed for anxiety.  . Cholecalciferol (VITAMIN D PO) Take 1,000 Units by mouth daily.    . COD LIVER OIL PO Take by mouth daily.  Marland Kitchen levothyroxine (SYNTHROID) 150 MCG tablet TAKE ONE TABLET (150MCG TOTAL) BY MOUTH DAILY BEFORE BREAKFAST  . omeprazole (PRILOSEC) 20 MG capsule TAKE ONE CAPSULE (20MG  TOTAL) BY MOUTH DAILY BEFORE BREAKFAST  . rosuvastatin (CRESTOR) 5 MG tablet Take 1 tablet (5 mg total) by mouth daily.  Marland Kitchen triamcinolone cream (KENALOG) 0.1 % Apply 1 application topically 2 (two) times daily.  . vitamin B-12 (CYANOCOBALAMIN) 500 MCG tablet Take 500 mcg by mouth daily.     No facility-administered encounter medications on file as of 09/11/2019.     Objective: Blood pressure 124/79, pulse 64, temperature 99.2 F (37.3 C), temperature source Oral, height 6' (1.829 m), weight 193 lb 4.8 oz (87.7 kg). Patient is alert and in no acute distress. She is wearing a mask. Conjunctiva is pink. Sclera is nonicteric Oropharyngeal mucosa is normal. No neck masses or thyromegaly noted. Cardiac exam with regular rhythm normal S1 and S2. No murmur or gallop noted. Lungs are clear to auscultation. Abdomen is symmetrical soft and nontender without organomegaly or masses. No LE edema or clubbing noted.  Lab data reviewed  Bone density study on 05/29/2015 revealed osteopenia.   Assessment:  #1.  Chronic GERD.  She had EGD in August 2015 and was negative for erosive esophagitis or Barrett's esophagus.  I do not believe she is ready for PPI dose to be changed at this time.  #2.  Family history positive for CRC in a first-degree relative  at age younger than 84.  Patient is up-to-date high rescreening schedule.  Next colonoscopy would be in December 2022.  #3.  Osteopenia noted on bone density in March 2017.  She should have bone density study if Dr. Delphina Cahill agrees.   Plan:  New prescription given for omeprazole 20 mg by mouth 30 minutes before breakfast daily.  90 doses with 3 refills. Office visit in 1 year. Patient to check with Dr. Delphina Cahill about getting bone  density.

## 2019-09-19 ENCOUNTER — Other Ambulatory Visit: Payer: Self-pay | Admitting: "Endocrinology

## 2019-09-19 ENCOUNTER — Telehealth: Payer: Self-pay | Admitting: "Endocrinology

## 2019-09-19 DIAGNOSIS — E890 Postprocedural hypothyroidism: Secondary | ICD-10-CM

## 2019-09-19 DIAGNOSIS — E782 Mixed hyperlipidemia: Secondary | ICD-10-CM

## 2019-09-19 NOTE — Telephone Encounter (Signed)
Pt called in today to cancel her appointment for tomorrow 7/22 because she states she could not get her labs done due to the staff not giving her lab papers to her and not returning her phone calls. I put lab orders in the mail today. Pt is aware.

## 2019-09-19 NOTE — Telephone Encounter (Signed)
Pt stated she will call back to reschedule after she gets her labs done.

## 2019-09-20 ENCOUNTER — Ambulatory Visit: Payer: 59 | Admitting: "Endocrinology

## 2019-10-30 ENCOUNTER — Ambulatory Visit (INDEPENDENT_AMBULATORY_CARE_PROVIDER_SITE_OTHER): Payer: 59 | Admitting: Internal Medicine

## 2019-12-03 ENCOUNTER — Other Ambulatory Visit (HOSPITAL_COMMUNITY): Payer: Self-pay | Admitting: Internal Medicine

## 2019-12-03 DIAGNOSIS — Z1231 Encounter for screening mammogram for malignant neoplasm of breast: Secondary | ICD-10-CM

## 2019-12-03 DIAGNOSIS — Z13820 Encounter for screening for osteoporosis: Secondary | ICD-10-CM

## 2019-12-31 ENCOUNTER — Ambulatory Visit (HOSPITAL_COMMUNITY): Payer: 59

## 2020-01-02 ENCOUNTER — Other Ambulatory Visit: Payer: Self-pay

## 2020-01-02 DIAGNOSIS — E890 Postprocedural hypothyroidism: Secondary | ICD-10-CM

## 2020-01-03 ENCOUNTER — Ambulatory Visit: Payer: 59 | Attending: Internal Medicine

## 2020-01-03 DIAGNOSIS — Z23 Encounter for immunization: Secondary | ICD-10-CM

## 2020-01-03 NOTE — Progress Notes (Signed)
   Covid-19 Vaccination Clinic  Name:  Elizabeth Lopez    MRN: [id number] DOB: Mar 06, 1955  01/03/2020  [name] was observed post Covid-19 immunization for 15 minutes without incident. She was provided with Vaccine Information Sheet and instruction to access the V-Safe system.   [name] was instructed to call 911 with any severe reactions post vaccine: Marland Kitchen Difficulty breathing  . Swelling of face and throat  . A fast heartbeat  . A bad rash all over body  . Dizziness and weakness

## 2020-01-05 LAB — TSH: TSH: 0.289 u[IU]/mL — ABNORMAL LOW (ref 0.450–4.500)

## 2020-01-05 LAB — T4, FREE: Free T4: 1.72 ng/dL (ref 0.82–1.77)

## 2020-01-10 ENCOUNTER — Encounter: Payer: Self-pay | Admitting: "Endocrinology

## 2020-01-10 ENCOUNTER — Telehealth (INDEPENDENT_AMBULATORY_CARE_PROVIDER_SITE_OTHER): Payer: 59 | Admitting: "Endocrinology

## 2020-01-10 VITALS — BP 110/70 | Ht 72.0 in | Wt 190.0 lb

## 2020-01-10 DIAGNOSIS — E782 Mixed hyperlipidemia: Secondary | ICD-10-CM | POA: Diagnosis not present

## 2020-01-10 DIAGNOSIS — E890 Postprocedural hypothyroidism: Secondary | ICD-10-CM

## 2020-01-10 MED ORDER — LEVOTHYROXINE SODIUM 137 MCG PO TABS
ORAL_TABLET | ORAL | 1 refills | Status: DC
Start: 2020-01-10 — End: 2020-07-09

## 2020-01-10 MED ORDER — ROSUVASTATIN CALCIUM 10 MG PO TABS
10.0000 mg | ORAL_TABLET | Freq: Every day | ORAL | 1 refills | Status: DC
Start: 2020-01-10 — End: 2020-02-04

## 2020-01-10 NOTE — Progress Notes (Signed)
01/10/2020                                     Endocrinology Telehealth Visit Follow up Note -During COVID -19 Pandemic  This visit type was conducted  via telephone due to national recommendations for restrictions regarding the COVID-19 Pandemic  in an effort to limit this patient's exposure and mitigate transmission of the corona virus.   I connected with Elizabeth Lopez on 01/10/2020   by telephone and verified that I am speaking with the correct person using two identifiers. Elizabeth Lopez, January 03, 1956. she has verbally consented to this visit.  I was in my office and patient was in her residence. No other persons were with me during the encounter. All issues noted in this document were discussed and addressed. The format was not optimal for physical exam.   Subjective:    Patient ID: Elizabeth Lopez, female    DOB: [birth year], PCP Celene Squibb, MD   Past Medical History:  Diagnosis Date   Anxiety    Chronic constipation    Family hx of colon cancer    GERD (gastroesophageal reflux disease)    Hyperlipidemia    Hypothyroidism    Past Surgical History:  Procedure Laterality Date   COLONOSCOPY     COLONOSCOPY  02/12/2011   Procedure: COLONOSCOPY;  Surgeon: Rogene Houston, MD;  [facility];  Service: Endoscopy;  Laterality: N/A;  9:45   COLONOSCOPY N/A 02/12/2016   Procedure: COLONOSCOPY;  Surgeon: Rogene Houston, MD;  [facility];  Service: Endoscopy;  Laterality: N/A;  730   ESOPHAGOGASTRODUODENOSCOPY N/A 10/05/2013   Procedure: ESOPHAGOGASTRODUODENOSCOPY (EGD);  Surgeon: Rogene Houston, MD;  [facility];  Service: Endoscopy;  Laterality: N/A;  1200-rescheduled to [place] notified pt   knot     patient had a knot removed from right wrisit 9 years ago.   LASIK     WRIST GANGLION EXCISION     right   Social History   Socioeconomic History   Marital status: Married    Spouse name: Not on file   Number of children: Not on  file   Years of education: Not on file   Highest education level: Not on file  Occupational History   Not on file  Tobacco Use   Smoking status: Never Smoker   Smokeless tobacco: Never Used  Vaping Use   Vaping Use: Never used  Substance and Sexual Activity   Alcohol use: Yes    Comment: occasional wine   Drug use: No   Sexual activity: Not on file  Other Topics Concern   Not on file  Social History Narrative   Not on file   Social Determinants of Health   Financial Resource Strain:    Difficulty of Paying Living Expenses: Not on file  Food Insecurity:    Worried About [place] in the Last Year: Not on file   Ran Out of Food in the Last Year: Not on file  Transportation Needs:    Lack of Transportation (Medical): Not on file   Lack of Transportation (Non-Medical): Not on file  Physical Activity:    Days of Exercise per Week: Not on file   Minutes of Exercise per Session: Not on file  Stress:    Feeling of Stress : Not on file  Social Connections:  Frequency of Communication with Friends and Family: Not on file   Frequency of Social Gatherings with Friends and Family: Not on file   Attends Religious Services: Not on file   Active Member of Clubs or Organizations: Not on file   Attends Club or Organization Meetings: Not on file   Marital Status: Not on file   Outpatient Encounter Medications as of 01/10/2020  Medication Sig   Calcium-Magnesium-Vitamin D (CALCIUM 1200+D3 PO) Take by mouth.   ELDERBERRY PO Take 1 tablet by mouth daily.   rosuvastatin (CRESTOR) 10 MG tablet Take 1 tablet (10 mg total) by mouth daily.   [DISCONTINUED] rosuvastatin (CRESTOR) 10 MG tablet Take 10 mg by mouth daily.   ALPRAZolam (XANAX) 0.5 MG tablet Take 0.5 mg by mouth 4 (four) times daily as needed for anxiety.   Cholecalciferol (VITAMIN D PO) Take 1,000 Units by mouth daily.    COD LIVER OIL PO Take by mouth daily.   levothyroxine  (SYNTHROID) 137 MCG tablet TAKE ONE TABLET (150MCG TOTAL) BY MOUTH DAILY BEFORE BREAKFAST   omeprazole (PRILOSEC) 20 MG capsule TAKE ONE CAPSULE (20MG  TOTAL) BY MOUTH DAILY BEFORE BREAKFAST   vitamin B-12 (CYANOCOBALAMIN) 500 MCG tablet Take 500 mcg by mouth daily.     [DISCONTINUED] levothyroxine (SYNTHROID) 150 MCG tablet TAKE ONE TABLET (150MCG TOTAL) BY MOUTH DAILY BEFORE BREAKFAST   [DISCONTINUED] rosuvastatin (CRESTOR) 5 MG tablet Take 1 tablet (5 mg total) by mouth daily.   No facility-administered encounter medications on file as of 01/10/2020.   ALLERGIES: No Known Allergies VACCINATION STATUS: Immunization History  Administered Date(s) Administered   Influenza-Unspecified 12/01/2015   Moderna SARS-COVID-2 Vaccination 04/23/2019, 05/22/2019, 01/03/2020    HPI  64 yo female with with Graves disease status po I131 therapy on Aril 2012. She is on Synthroid 150 mcg p.o. daily before breakfast.  She is being engaged in telehealth for follow-up with repeat thyroid function test. She has no new complaints, however lost 40 pounds since last visit. Her previsit labs are consistent with slight over replacement. She reports more consistency taking her Crestor for hyperlipidemia as well.   she denies cold intolerance, heat intolerance, and palpitation.    Review of Systems  Limited as above.  Objective:    BP 110/70    Ht 6' (1.829 m)    Wt 190 lb (86.2 kg)    BMI 25.77 kg/m   Wt Readings from Last 3 Encounters:  01/10/20 190 lb (86.2 kg)  09/11/19 193 lb 4.8 oz (87.7 kg)  08/18/19 185 lb (83.9 kg)    Physical Exam   Results for orders placed or performed in visit on 01/02/20  T4, Free  Result Value Ref Range   Free T4 1.72 0.82 - 1.77 ng/dL  TSH  Result Value Ref Range   TSH 0.289 (L) 0.450 - 4.500 uIU/mL   Complete Blood Count (Most recent): Lab Results  Component Value Date   WBC 7.1 11/10/2018   HGB 11.8 (A) 06/16/2010   HCT 42 (A) 11/10/2018   MCV 90  11/10/2018   PLT 279 06/16/2010   Chemistry (most recent): Lab Results  Component Value Date   NA 135 11/10/2018   K 4.3 11/10/2018   CL 99 11/10/2018   CO2 25 11/10/2018   BUN 14 11/10/2018   CREATININE 0.83 11/10/2018      Assessment & Plan:    Hypothyroidism due to RAI   - Her visit thyroid function tests are consistent with slight over replacement. I discussed  and lowered her levothyroxine to 137 mcg p.o. daily before breakfast.    - We discussed about the correct intake of her thyroid hormone, on empty stomach at fasting, with water, separated by at least 30 minutes from breakfast and other medications,  and separated by more than 4 hours from calcium, iron, multivitamins, acid reflux medications (PPIs). -Patient is made aware of the fact that thyroid hormone replacement is needed for life, dose to be adjusted by periodic monitoring of thyroid function tests.   Hyperlipidemia: She was kept on Crestor 10 mg p.o. nightly. Her LDL prior to her last visit was 113. She is advised to continue on same dose, will repeat fasting lipid panel before her next visit.   - I advised patient to maintain close follow up with Celene Squibb, MD for primary care needs.      - Time spent on this patient care encounter:  20 minutes of which 50% was spent in  counseling and the rest reviewing  her current and  previous labs / studies and medications  doses and developing a plan for long term care. Elizabeth Lopez  participated in the discussions, expressed understanding, and voiced agreement with the above plans.  All questions were answered to her satisfaction. she is encouraged to contact clinic should she have any questions or concerns prior to her return visit.   Follow up plan: Return in about 6 months (around 07/09/2020) for F/U with Pre-visit Labs.  Glade Lloyd, MD Phone: [phone]  Fax: [phone]  -  This note was partially dictated with voice recognition software. Similar sounding  words can be transcribed inadequately or may not  be corrected upon review.  01/10/2020, 9:59 AM

## 2020-02-04 ENCOUNTER — Other Ambulatory Visit: Payer: Self-pay

## 2020-02-04 ENCOUNTER — Telehealth: Payer: Self-pay | Admitting: "Endocrinology

## 2020-02-04 DIAGNOSIS — E782 Mixed hyperlipidemia: Secondary | ICD-10-CM

## 2020-02-04 MED ORDER — ROSUVASTATIN CALCIUM 10 MG PO TABS: 10.0000 mg | ORAL_TABLET | Freq: Every day | ORAL | 1 refills | Status: DC

## 2020-02-04 NOTE — Telephone Encounter (Signed)
Sent in

## 2020-02-04 NOTE — Telephone Encounter (Signed)
Pt is calling and states rosuvastatin (CRESTOR) 10 MG tablet Was sent to the wrong pharmacy. It needs to go to the below pharmacy  [place], [place] Phone:  [phone]  Fax:  [phone]

## 2020-05-20 ENCOUNTER — Other Ambulatory Visit (HOSPITAL_COMMUNITY): Payer: 59

## 2020-06-09 ENCOUNTER — Other Ambulatory Visit: Payer: Self-pay

## 2020-06-09 ENCOUNTER — Ambulatory Visit (HOSPITAL_COMMUNITY)
Admission: RE | Admit: 2020-06-09 | Discharge: 2020-06-09 | Disposition: A | Discharge status: Home / Self Care | Payer: 59 | Source: Ambulatory Visit | Attending: Internal Medicine | Admitting: Internal Medicine

## 2020-06-09 DIAGNOSIS — Z13820 Encounter for screening for osteoporosis: Secondary | ICD-10-CM

## 2020-07-03 LAB — LIPID PANEL
Cholesterol: 168 (ref 0–200)
HDL: 55 (ref 35–70)
LDL Cholesterol: 102
Triglycerides: 57 (ref 40–160)

## 2020-07-03 LAB — TSH: TSH: 0.9 (ref 0.41–5.90)

## 2020-07-09 ENCOUNTER — Other Ambulatory Visit: Payer: Self-pay

## 2020-07-09 ENCOUNTER — Ambulatory Visit: Payer: 59 | Admitting: "Endocrinology

## 2020-07-09 ENCOUNTER — Encounter: Payer: Self-pay | Admitting: "Endocrinology

## 2020-07-09 VITALS — BP 106/73 | HR 67 | Ht 72.0 in | Wt 196.0 lb

## 2020-07-09 DIAGNOSIS — E890 Postprocedural hypothyroidism: Secondary | ICD-10-CM | POA: Diagnosis not present

## 2020-07-09 DIAGNOSIS — E782 Mixed hyperlipidemia: Secondary | ICD-10-CM

## 2020-07-09 MED ORDER — LEVOTHYROXINE SODIUM 137 MCG PO TABS: ORAL_TABLET | ORAL | 3 refills | Status: DC

## 2020-07-09 NOTE — Progress Notes (Signed)
07/09/2020      Endocrinology follow-up note   Subjective:    Patient ID: Elizabeth Lopez, female    DOB: [birth year], PCP Celene Squibb, MD   Past Medical History:  Diagnosis Date  . Anxiety   . Chronic constipation   . Family hx of colon cancer   . GERD (gastroesophageal reflux disease)   . Hyperlipidemia   . Hypothyroidism    Past Surgical History:  Procedure Laterality Date  . COLONOSCOPY    . COLONOSCOPY  02/12/2011   Procedure: COLONOSCOPY;  Surgeon: Rogene Houston, MD;  Location: [facility];  Service: Endoscopy;  Laterality: N/A;  9:45  . COLONOSCOPY N/A 02/12/2016   Procedure: COLONOSCOPY;  Surgeon: Rogene Houston, MD;  Location: [facility];  Service: Endoscopy;  Laterality: N/A;  730  . ESOPHAGOGASTRODUODENOSCOPY N/A 10/05/2013   Procedure: ESOPHAGOGASTRODUODENOSCOPY (EGD);  Surgeon: Rogene Houston, MD;  Location: [facility];  Service: Endoscopy;  Laterality: N/A;  1200-rescheduled to [place] notified pt  . knot     patient had a knot removed from right wrisit 9 years ago.  Marland Kitchen LASIK    . WRIST GANGLION EXCISION     right   Social History   Socioeconomic History  . Marital status: Married    Spouse name: Not on file  . Number of children: Not on file  . Years of education: Not on file  . Highest education level: Not on file  Occupational History  . Not on file  Tobacco Use  . Smoking status: Never Smoker  . Smokeless tobacco: Never Used  Vaping Use  . Vaping Use: Never used  Substance and Sexual Activity  . Alcohol use: Yes    Comment: occasional wine  . Drug use: No  . Sexual activity: Not on file  Other Topics Concern  . Not on file  Social History Narrative  . Not on file   Social Determinants of Health   Financial Resource Strain: Not on file  Food Insecurity: Not on file  Transportation Needs: Not on file  Physical Activity: Not on file  Stress: Not on file  Social Connections: Not on file   Outpatient Encounter Medications as  of 07/09/2020  Medication Sig  . ALPRAZolam (XANAX) 0.5 MG tablet Take 0.5 mg by mouth 4 (four) times daily as needed for anxiety.  . Calcium-Magnesium-Vitamin D (CALCIUM 1200+D3 PO) Take by mouth.  . Cholecalciferol (VITAMIN D PO) Take 1,000 Units by mouth daily.   . COD LIVER OIL PO Take by mouth daily.  Marland Kitchen ELDERBERRY PO Take 1 tablet by mouth daily.  Marland Kitchen levothyroxine (SYNTHROID) 137 MCG tablet TAKE ONE TABLET (150MCG TOTAL) BY MOUTH DAILY BEFORE BREAKFAST  . omeprazole (PRILOSEC) 20 MG capsule TAKE ONE CAPSULE (20MG  TOTAL) BY MOUTH DAILY BEFORE BREAKFAST  . rosuvastatin (CRESTOR) 10 MG tablet Take 1 tablet (10 mg total) by mouth daily.  . vitamin B-12 (CYANOCOBALAMIN) 500 MCG tablet Take 500 mcg by mouth daily.    . [DISCONTINUED] levothyroxine (SYNTHROID) 137 MCG tablet TAKE ONE TABLET (150MCG TOTAL) BY MOUTH DAILY BEFORE BREAKFAST   No facility-administered encounter medications on file as of 07/09/2020.   ALLERGIES: No Known Allergies VACCINATION STATUS: Immunization History  Administered Date(s) Administered  . Influenza-Unspecified 12/01/2015  . Moderna Sars-Covid-2 Vaccination 04/23/2019, 05/22/2019, 01/03/2020    HPI  65 yo female with with Graves disease status po I131 therapy on Aril 2012. She remains on Synthroid 137 mcg p.o. daily before breakfast.  She has no new complaints today.  She reports compliance to her medication.  Her previsit labs are consistent with appropriate replacement.  She is also on Crestor 10 mg p.o. nightly for hyperlipidemia.   -She has fluctuating body weight with 3 pounds over the last year. -She denies palpitations, tremors, heat intolerance.   Review of Systems  Limited as above.  Objective:    BP 106/73   Pulse 67   Ht 6' (1.829 m)   Wt 196 lb (88.9 kg)   LMP 07/30/2016 (Approximate)   BMI 26.58 kg/m   Wt Readings from Last 3 Encounters:  07/09/20 196 lb (88.9 kg)  01/10/20 190 lb (86.2 kg)  09/11/19 193 lb 4.8 oz (87.7 kg)     Physical Exam   Results for orders placed or performed in visit on 07/04/20  Lipid panel  Result Value Ref Range   Triglycerides 57 40 - 160   Cholesterol 168 0 - 200   HDL 55 35 - 70   LDL Cholesterol 102   TSH  Result Value Ref Range   TSH 0.90 0.41 - 5.90   Complete Blood Count (Most recent): Lab Results  Component Value Date   WBC 7.1 11/10/2018   HGB 11.8 (A) 06/16/2010   HCT 42 (A) 11/10/2018   MCV 90 11/10/2018   PLT 279 06/16/2010   Chemistry (most recent): Lab Results  Component Value Date   NA 135 11/10/2018   K 4.3 11/10/2018   CL 99 11/10/2018   CO2 25 11/10/2018   BUN 14 11/10/2018   CREATININE 0.83 11/10/2018      Assessment & Plan:    Hypothyroidism due to RAI   - Her visit thyroid function tests are consistent with appropriate replacement.  She is advised to continue Synthroid 137 micrograms daily before breakfast.     - We discussed about the correct intake of her thyroid hormone, on empty stomach at fasting, with water, separated by at least 30 minutes from breakfast and other medications,  and separated by more than 4 hours from calcium, iron, multivitamins, acid reflux medications (PPIs). -Patient is made aware of the fact that thyroid hormone replacement is needed for life, dose to be adjusted by periodic monitoring of thyroid function tests.   Hyperlipidemia: She was kept on Crestor 10 mg p.o. nightly. Her LDL has improved to 102, overall improving from 199.  She is advised to continue with the same dose, will repeat fasting lipid panel along with her next thyroid function test in a year.    - I advised patient to maintain close follow up with Celene Squibb, MD for primary care needs.    I spent 23 minutes in the care of the patient today including review of labs from Thyroid Function, CMP, and other relevant labs ; imaging/biopsy records (current and previous including abstractions from other facilities); face-to-face time discussing  her  lab results and symptoms, medications doses, her options of short and long term treatment based on the latest standards of care / guidelines;   and documenting the encounter.  Elizabeth Lopez  participated in the discussions, expressed understanding, and voiced agreement with the above plans.  All questions were answered to her satisfaction. she is encouraged to contact clinic should she have any questions or concerns prior to her return visit.    Follow up plan: Return in about 1 year (around 07/09/2021) for F/U with Pre-visit Labs.  Glade Lloyd, MD Phone: [phone]  Fax: [phone]  -  This note was partially dictated with voice recognition software. Similar sounding words can be transcribed inadequately or may not  be corrected upon review.  07/09/2020, 4:47 PM

## 2020-09-16 ENCOUNTER — Ambulatory Visit (INDEPENDENT_AMBULATORY_CARE_PROVIDER_SITE_OTHER): Payer: 59 | Admitting: Internal Medicine

## 2020-09-16 ENCOUNTER — Other Ambulatory Visit: Payer: Self-pay

## 2020-09-16 ENCOUNTER — Encounter (INDEPENDENT_AMBULATORY_CARE_PROVIDER_SITE_OTHER): Payer: Self-pay | Admitting: Internal Medicine

## 2020-09-16 VITALS — BP 144/80 | HR 65 | Temp 99.0°F | Ht 72.0 in | Wt 189.0 lb

## 2020-09-16 DIAGNOSIS — K219 Gastro-esophageal reflux disease without esophagitis: Secondary | ICD-10-CM

## 2020-09-16 NOTE — Progress Notes (Signed)
Presenting complaint;  Follow for chronic GERD.  Database and subjective:  Patient is 65 year old African-American female with history of chronic GERD who is here for yearly visit.  She was last seen on September 11, 2019. She states she is doing well with omeprazole daily and she is watching her diet.  She does not recall the last time she had heartburn.  She says she has never skipped a dose.  She has changed her eating habits.  She only drinks 1 cup of coffee a day and 8 to 10 glasses water.  She does not drink carbonated drinks.  She denies dysphagia hoarseness chronic cough or sore throat. Patient states she had bone density by Dr. Delphina Cahill in April this year and she was found to have osteoporosis.  She was advised to take calcium but she developed constipation and therefore stopped calcium.  She states her bowels have still not returned back to normal.  She denies melena or rectal bleeding.  She is not convinced that she has osteoporosis.  She says she is very active.  She walks 4 miles almost daily.  She has been doing this for several years.  She also goes to the gym for 30-minute workout Monday Wednesday and Friday. She is willing to try omeprazole every other day since she has been diagnosed with osteoporosis.  Prior GI studies  She underwent high rescreening colonoscopy in December 2017.  She had external hemorrhoids otherwise normal examination.  Her mother was diagnosed with colon carcinoma at age 4 and died of metastatic disease 13 years later.  She had EGD in July 2015 revealing small sliding hiatal hernia.  Mucosa of esophagus revealed some granularity but biopsies revealed no abnormality.   Current Medications: Outpatient Encounter Medications as of 09/16/2020  Medication Sig   ALPRAZolam (XANAX) 0.5 MG tablet Take 0.5 mg by mouth 4 (four) times daily as needed for anxiety.   Cholecalciferol (VITAMIN D PO) Take 1,000 Units by mouth daily.    COD LIVER OIL PO Take by mouth daily.    ELDERBERRY PO Take 1 tablet by mouth daily.   levothyroxine (SYNTHROID) 137 MCG tablet TAKE ONE TABLET (150MCG TOTAL) BY MOUTH DAILY BEFORE BREAKFAST   omeprazole (PRILOSEC) 20 MG capsule TAKE ONE CAPSULE (20MG  TOTAL) BY MOUTH DAILY BEFORE BREAKFAST   rosuvastatin (CRESTOR) 10 MG tablet Take 1 tablet (10 mg total) by mouth daily.   vitamin B-12 (CYANOCOBALAMIN) 500 MCG tablet Take 500 mcg by mouth daily.     [DISCONTINUED] Calcium-Magnesium-Vitamin D (CALCIUM 1200+D3 PO) Take by mouth.   No facility-administered encounter medications on file as of 09/16/2020.    Objective: Blood pressure (!) 144/80, pulse 65, temperature 99 F (37.2 C), temperature source Oral, height 6' (1.829 m), weight 189 lb (85.7 kg), last menstrual period 07/30/2016. Patient is alert and in no acute distress. Patient is wearing a mask. Conjunctiva is pink. Sclera is nonicteric Oropharyngeal mucosa is normal. No neck masses or thyromegaly noted. Cardiac exam with regular rhythm normal S1 and S2. No murmur or gallop noted. Lungs are clear to auscultation. Abdomen is symmetrical soft and nontender with organomegaly or masses. No LE edema or clubbing noted.  Labs/studies Results:  Bone density study on 06/09/2020  T score at L1-L3 was -3.4  Bone density on 05/29/2015  T score at L1 L3 was -2.2.   Assessment:  #1.  Chronic GERD.  She does not have Barrett's esophagus.  She is doing well with dietary measures and the low-dose omeprazole.  Since she has been diagnosed with osteoporosis it would be reasonable to decrease PPI dose.  #2.  Patient is high risk for CRC given family history of colon cancer in mother at age 22.  She is due for exam in December this year.  Plan:   Patient will try omeprazole 20 mg every other day.  She can take famotidine OTC 20 mg on off days on as-needed basis. She will continue antireflux measures as before. High rescreening colonoscopy in December 2022. Recommend she review  other treatment options for osteoporosis. She will also have vitamin D2 level checked when she has next blood work by Dr. Delphina Cahill. Office visit in 1 year.

## 2020-09-16 NOTE — Patient Instructions (Signed)
Try Omeprazole every other day. Can take Pepcid OTc 20 mg on off days if needed. High risk screening colonoscopy to be scheduled in December ,20222

## 2020-11-14 ENCOUNTER — Other Ambulatory Visit (INDEPENDENT_AMBULATORY_CARE_PROVIDER_SITE_OTHER): Payer: Self-pay | Admitting: Internal Medicine

## 2020-12-15 ENCOUNTER — Other Ambulatory Visit (HOSPITAL_COMMUNITY): Payer: Self-pay | Admitting: Internal Medicine

## 2020-12-15 DIAGNOSIS — Z1231 Encounter for screening mammogram for malignant neoplasm of breast: Secondary | ICD-10-CM

## 2020-12-16 ENCOUNTER — Encounter (INDEPENDENT_AMBULATORY_CARE_PROVIDER_SITE_OTHER): Payer: Self-pay

## 2020-12-16 ENCOUNTER — Telehealth (INDEPENDENT_AMBULATORY_CARE_PROVIDER_SITE_OTHER): Payer: Self-pay

## 2020-12-16 ENCOUNTER — Other Ambulatory Visit (INDEPENDENT_AMBULATORY_CARE_PROVIDER_SITE_OTHER): Payer: Self-pay

## 2020-12-16 DIAGNOSIS — Z1211 Encounter for screening for malignant neoplasm of colon: Secondary | ICD-10-CM

## 2020-12-16 MED ORDER — PLENVU 140 G PO SOLR: 1.0000 | Freq: Once | ORAL | 0 refills | Status: AC

## 2020-12-16 NOTE — Telephone Encounter (Signed)
Elizabeth Lopez, CMA  

## 2020-12-31 ENCOUNTER — Ambulatory Visit (HOSPITAL_COMMUNITY): Payer: 59

## 2021-01-07 ENCOUNTER — Ambulatory Visit (HOSPITAL_COMMUNITY)
Admission: RE | Admit: 2021-01-07 | Discharge: 2021-01-07 | Disposition: A | Discharge status: Home / Self Care | Payer: 59 | Source: Ambulatory Visit | Attending: Internal Medicine | Admitting: Internal Medicine

## 2021-01-07 ENCOUNTER — Other Ambulatory Visit: Payer: Self-pay

## 2021-01-07 DIAGNOSIS — Z1231 Encounter for screening mammogram for malignant neoplasm of breast: Secondary | ICD-10-CM | POA: Diagnosis present

## 2021-01-08 ENCOUNTER — Telehealth (INDEPENDENT_AMBULATORY_CARE_PROVIDER_SITE_OTHER): Payer: Self-pay | Admitting: *Deleted

## 2021-01-08 NOTE — Telephone Encounter (Signed)
Has colonoscopy next Wednesday and started having burning in her mid abdomen around navel. Burning is off and on when eating certain foods or when stressed. Tums does help sometimes. Would like to know if she should have egd with the colonoscopy.

## 2021-01-08 NOTE — Patient Instructions (Signed)
   Your procedure is scheduled on: 01/14/2021  Report to [facility] at  7:00   AM.  Call this number if you have problems the morning of surgery: [phone]   Remember:              Follow Directions on the letter you received from Your Physician's office regarding the Bowel Prep              No Smoking the day of Procedure :   Take these medicines the morning of surgery with A SIP OF WATER: levothyroxine and Omeprazole   Do not wear jewelry, make-up or nail polish.    Do not bring valuables to the hospital.  Contacts, dentures or bridgework may not be worn into surgery.  .   Patients discharged the day of surgery will not be allowed to drive home.     Colonoscopy, Adult, Care After This sheet gives you information about how to care for yourself after your procedure. Your health care provider may also give you more specific instructions. If you have problems or questions, contact your health care provider. What can I expect after the procedure? After the procedure, it is common to have: A small amount of blood in your stool for 24 hours after the procedure. Some gas. Mild abdominal cramping or bloating.  Follow these instructions at home: General instructions  For the first 24 hours after the procedure: Do not drive or use machinery. Do not sign important documents. Do not drink alcohol. Do your regular daily activities at a slower pace than normal. Eat soft, easy-to-digest foods. Rest often. Take over-the-counter or prescription medicines only as told by your health care provider. It is up to you to get the results of your procedure. Ask your health care provider, or the department performing the procedure, when your results will be ready. Relieving cramping and bloating Try walking around when you have cramps or feel bloated. Apply heat to your abdomen as told by your health care provider. Use a heat source that your health care provider recommends, such as a moist heat  pack or a heating pad. Place a towel between your skin and the heat source. Leave the heat on for 20-30 minutes. Remove the heat if your skin turns bright red. This is especially important if you are unable to feel pain, heat, or cold. You may have a greater risk of getting burned. Eating and drinking Drink enough fluid to keep your urine clear or pale yellow. Resume your normal diet as instructed by your health care provider. Avoid heavy or fried foods that are hard to digest. Avoid drinking alcohol for as long as instructed by your health care provider. Contact a health care provider if: You have blood in your stool 2-3 days after the procedure. Get help right away if: You have more than a small spotting of blood in your stool. You pass large blood clots in your stool. Your abdomen is swollen. You have nausea or vomiting. You have a fever. You have increasing abdominal pain that is not relieved with medicine. This information is not intended to replace advice given to you by your health care provider. Make sure you discuss any questions you have with your health care provider. Document Released: 09/30/2003 Document Revised: 11/10/2015 Document Reviewed: 04/29/2015 Elsevier Interactive Patient Education  [employer].

## 2021-01-09 NOTE — Telephone Encounter (Signed)
Per dr Laural Golden pt needs to take prilosec every day instead of every other day. She is not having any vomiting. States he will talk with her before procedure. Called and discussed with pt. Pt verbalized understanding.

## 2021-01-12 ENCOUNTER — Other Ambulatory Visit: Payer: Self-pay

## 2021-01-12 ENCOUNTER — Encounter (HOSPITAL_COMMUNITY)
Admission: RE | Admit: 2021-01-12 | Discharge: 2021-01-12 | Disposition: A | Discharge status: Home / Self Care | Payer: 59 | Source: Ambulatory Visit | Attending: Internal Medicine | Admitting: Internal Medicine

## 2021-01-13 ENCOUNTER — Other Ambulatory Visit (HOSPITAL_COMMUNITY): Payer: Self-pay | Admitting: Internal Medicine

## 2021-01-13 DIAGNOSIS — R928 Other abnormal and inconclusive findings on diagnostic imaging of breast: Secondary | ICD-10-CM

## 2021-01-14 ENCOUNTER — Ambulatory Visit (HOSPITAL_COMMUNITY): Payer: 59 | Admitting: Anesthesiology

## 2021-01-14 ENCOUNTER — Encounter (HOSPITAL_COMMUNITY)
Admission: RE | Disposition: A | Discharge status: Home / Self Care | Payer: Self-pay | Source: Home / Self Care | Attending: Internal Medicine

## 2021-01-14 ENCOUNTER — Encounter (INDEPENDENT_AMBULATORY_CARE_PROVIDER_SITE_OTHER): Payer: Self-pay | Admitting: *Deleted

## 2021-01-14 ENCOUNTER — Ambulatory Visit (HOSPITAL_COMMUNITY)
Admission: RE | Admit: 2021-01-14 | Discharge: 2021-01-14 | Disposition: A | Discharge status: Home / Self Care | Payer: 59 | Attending: Internal Medicine | Admitting: Internal Medicine

## 2021-01-14 ENCOUNTER — Encounter (HOSPITAL_COMMUNITY): Payer: Self-pay | Admitting: Internal Medicine

## 2021-01-14 DIAGNOSIS — K219 Gastro-esophageal reflux disease without esophagitis: Secondary | ICD-10-CM | POA: Insufficient documentation

## 2021-01-14 DIAGNOSIS — K5730 Diverticulosis of large intestine without perforation or abscess without bleeding: Secondary | ICD-10-CM | POA: Diagnosis not present

## 2021-01-14 DIAGNOSIS — K644 Residual hemorrhoidal skin tags: Secondary | ICD-10-CM | POA: Insufficient documentation

## 2021-01-14 DIAGNOSIS — E039 Hypothyroidism, unspecified: Secondary | ICD-10-CM | POA: Insufficient documentation

## 2021-01-14 DIAGNOSIS — F419 Anxiety disorder, unspecified: Secondary | ICD-10-CM | POA: Insufficient documentation

## 2021-01-14 DIAGNOSIS — Z800 Family history of malignant neoplasm of digestive organs: Secondary | ICD-10-CM | POA: Insufficient documentation

## 2021-01-14 DIAGNOSIS — Z1211 Encounter for screening for malignant neoplasm of colon: Secondary | ICD-10-CM | POA: Insufficient documentation

## 2021-01-14 HISTORY — PX: COLONOSCOPY WITH PROPOFOL: SHX5780

## 2021-01-14 LAB — HM COLONOSCOPY

## 2021-01-14 SURGERY — COLONOSCOPY WITH PROPOFOL
Anesthesia: General

## 2021-01-14 MED ORDER — LIDOCAINE HCL (CARDIAC) PF 100 MG/5ML IV SOSY
PREFILLED_SYRINGE | INTRAVENOUS | Status: DC | PRN
  Administered 2021-01-14: 50 mg via INTRATRACHEAL

## 2021-01-14 MED ORDER — LACTATED RINGERS IV SOLN: INTRAVENOUS | Status: DC

## 2021-01-14 MED ORDER — PROPOFOL 500 MG/50ML IV EMUL
INTRAVENOUS | Status: DC | PRN
  Administered 2021-01-14: 125 ug/kg/min via INTRAVENOUS

## 2021-01-14 MED ORDER — PROPOFOL 10 MG/ML IV BOLUS
INTRAVENOUS | Status: DC | PRN
  Administered 2021-01-14: 100 mg via INTRAVENOUS

## 2021-01-14 MED ORDER — LACTATED RINGERS IV SOLN: INTRAVENOUS | Status: DC | PRN

## 2021-01-14 NOTE — Op Note (Signed)
[facility] Patient Name: Elizabeth Lopez Procedure Date: 01/14/2021 8:25 AM MRN: [id number] Date of Birth: [birth year] Attending MD: Hildred Laser , MD CSN: [id number] Age: 65 Admit Type: Outpatient Procedure:                Colonoscopy Indications:              Screening in patient at increased risk: Colorectal                            cancer in mother before age 70 Providers:                Hildred Laser, MD, Charlsie Quest. Theda Sers RN, RN,                            Raphael Gibney, Technician Referring MD:             Delphina Cahill, MD Medicines:                Propofol per Anesthesia Complications:            No immediate complications. Estimated Blood Loss:     Estimated blood loss: none. Procedure:                Pre-Anesthesia Assessment:                           - Prior to the procedure, a History and Physical                            was performed, and patient medications and                            allergies were reviewed. The patient's tolerance of                            previous anesthesia was also reviewed. The risks                            and benefits of the procedure and the sedation                            options and risks were discussed with the patient.                            All questions were answered, and informed consent                            was obtained. Prior Anticoagulants: The patient has                            taken no previous anticoagulant or antiplatelet                            agents. ASA Grade Assessment: II - A patient with  mild systemic disease. After reviewing the risks                            and benefits, the patient was deemed in                            satisfactory condition to undergo the procedure.                           After obtaining informed consent, the colonoscope                            was passed under direct vision. Throughout the                             procedure, the patient's blood pressure, pulse, and                            oxygen saturations were monitored continuously. The                            [id number]-[id number] ([id number]) scope was introduced through                            the anus and advanced to the the cecum, identified                            by appendiceal orifice and ileocecal valve. The                            colonoscopy was performed without difficulty. The                            patient tolerated the procedure well. The quality                            of the bowel preparation was excellent. The                            ileocecal valve, appendiceal orifice, and rectum                            were photographed. Scope In: 8:48:03 AM Scope Out: 9:04:40 AM Scope Withdrawal Time: 0 hours 6 minutes 35 seconds  Total Procedure Duration: 0 hours 16 minutes 37 seconds  Findings:      The perianal and digital rectal examinations were normal.      A single small-mouthed diverticulum was found in the distal sigmoid       colon.      The exam was otherwise normal throughout the examined colon.      External hemorrhoids were found during retroflexion. The hemorrhoids       were small. Impression:               - Diverticulosis in the distal sigmoid colon.                           -  External hemorrhoids.                           - No specimens collected. Moderate Sedation:      Per Anesthesia Care Recommendation:           - Patient has a contact number available for                            emergencies. The signs and symptoms of potential                            delayed complications were discussed with the                            patient. Return to normal activities tomorrow.                            Written discharge instructions were provided to the                            patient.                           - High fiber diet today.                           - Continue present medications.                            - Repeat colonoscopy in 5 years for screening                            purposes. Procedure Code(s):        --- Professional ---                           [phone], Colonoscopy, flexible; diagnostic, including                            collection of specimen(s) by brushing or washing,                            when performed (separate procedure) Diagnosis Code(s):        --- Professional ---                           Z80.0, Family history of malignant neoplasm of                            digestive organs                           K64.4, Residual hemorrhoidal skin tags                           K57.30, Diverticulosis of large intestine without  perforation or abscess without bleeding CPT copyright 2019 American Medical Association. All rights reserved. The codes documented in this report are preliminary and upon coder review may  be revised to meet current compliance requirements. Hildred Laser, MD Hildred Laser, MD 01/14/2021 9:11:00 AM This report has been signed electronically. Number of Addenda: 0

## 2021-01-14 NOTE — Anesthesia Preprocedure Evaluation (Signed)
Anesthesia Evaluation  Patient identified by MRN, date of birth, ID band Patient awake    Reviewed: Allergy & Precautions, H&P , NPO status , Patient's Chart, lab work & pertinent test results, reviewed documented beta blocker date and time   Airway Mallampati: II  TM Distance: >3 FB Neck ROM: full    Dental no notable dental hx.    Pulmonary neg pulmonary ROS,    Pulmonary exam normal breath sounds clear to auscultation       Cardiovascular Exercise Tolerance: Good negative cardio ROS   Rhythm:regular Rate:Normal     Neuro/Psych PSYCHIATRIC DISORDERS Anxiety negative neurological ROS     GI/Hepatic Neg liver ROS, GERD  Medicated,  Endo/Other  Hypothyroidism   Renal/GU negative Renal ROS  negative genitourinary   Musculoskeletal   Abdominal   Peds  Hematology negative hematology ROS (+)   Anesthesia Other Findings   Reproductive/Obstetrics negative OB ROS                             Anesthesia Physical Anesthesia Plan  ASA: 2  Anesthesia Plan: General   Post-op Pain Management:    Induction:   PONV Risk Score and Plan: Propofol infusion  Airway Management Planned:   Additional Equipment:   Intra-op Plan:   Post-operative Plan:   Informed Consent: I have reviewed the patients History and Physical, chart, labs and discussed the procedure including the risks, benefits and alternatives for the proposed anesthesia with the patient or authorized representative who has indicated his/her understanding and acceptance.     Dental Advisory Given  Plan Discussed with: CRNA  Anesthesia Plan Comments:         Anesthesia Quick Evaluation

## 2021-01-14 NOTE — H&P (Signed)
Elizabeth Lopez is an 65 y.o. female.   Chief Complaint: Patient is here for colonoscopy. HPI:   Patient is 65 year old African-American female who is here for high risk screening colonoscopy.  Last exam was normal in December 2017.  She denies abdominal pain change in bowel habits or rectal bleeding. Mother was diagnosed with colon cancer at age 9.  She developed liver mets 10 years.  She has is older sister was had polyps removed.  Past Medical History:  Diagnosis Date   Anxiety    Chronic constipation    Family hx of colon cancer    GERD (gastroesophageal reflux disease)    Hyperlipidemia    Hypothyroidism     Past Surgical History:  Procedure Laterality Date   COLONOSCOPY     COLONOSCOPY  02/12/2011   Procedure: COLONOSCOPY;  Surgeon: Rogene Houston, MD;  [facility];  Service: Endoscopy;  Laterality: N/A;  9:45   COLONOSCOPY N/A 02/12/2016   Procedure: COLONOSCOPY;  Surgeon: Rogene Houston, MD;  [facility];  Service: Endoscopy;  Laterality: N/A;  730   ESOPHAGOGASTRODUODENOSCOPY N/A 10/05/2013   Procedure: ESOPHAGOGASTRODUODENOSCOPY (EGD);  Surgeon: Rogene Houston, MD;  [facility];  Service: Endoscopy;  Laterality: N/A;  1200-rescheduled to [place] notified pt   knot     patient had a knot removed from right wrisit 9 years ago.   LASIK     WRIST GANGLION EXCISION     right    Family History  Problem Relation Age of Onset   Colon cancer Mother    Cancer Mother    Healthy Sister    Healthy Brother    Healthy Sister    Healthy Sister    Healthy Brother    Healthy Brother    Cancer Father    Social History:  reports that she has never smoked. She has never used smokeless tobacco. She reports current alcohol use. She reports that she does not use drugs.  Allergies: No Known Allergies  Medications Prior to Admission  Medication Sig Dispense Refill   ALPRAZolam (XANAX) 0.5 MG tablet Take 0.5 mg by mouth 2 (two) times daily as  needed for anxiety.     Cholecalciferol (VITAMIN D PO) Take 1 tablet by mouth daily.     COD LIVER OIL PO Take 1 capsule by mouth daily.     levothyroxine (SYNTHROID) 137 MCG tablet TAKE ONE TABLET (150MCG TOTAL) BY MOUTH DAILY BEFORE BREAKFAST (Patient taking differently: Take 137 mcg by mouth daily before breakfast.) 90 tablet 3   omeprazole (PRILOSEC) 20 MG capsule TAKE ONE CAPSULE (20MG  TOTAL) BY MOUTH DAILY BEFORE BREAKFAST 90 capsule 3   phentermine (ADIPEX-P) 37.5 MG tablet Take 37.5 mg by mouth every Monday.     rosuvastatin (CRESTOR) 10 MG tablet Take 1 tablet (10 mg total) by mouth daily. 90 tablet 1   vitamin B-12 (CYANOCOBALAMIN) 500 MCG tablet Take 500 mcg by mouth daily.        No results found for this or any previous visit (from the past 48 hour(s)). No results found.  Review of Systems  Blood pressure 109/82, pulse 76, temperature 97.7 F (36.5 C), temperature source Oral, resp. rate 16, last menstrual period 07/30/2016, SpO2 100 %. Physical Exam HENT:     Mouth/Throat:     Mouth: Mucous membranes are moist.     Pharynx: Oropharynx is clear.  Eyes:     General: No scleral icterus.    Conjunctiva/sclera:  Conjunctivae normal.  Cardiovascular:     Rate and Rhythm: Normal rate and regular rhythm.     Heart sounds: Normal heart sounds. No murmur heard. Pulmonary:     Effort: Pulmonary effort is normal.     Breath sounds: Normal breath sounds.  Abdominal:     General: There is no distension.     Palpations: Abdomen is soft. There is no mass.  Musculoskeletal:        General: No swelling.     Cervical back: Neck supple.  Lymphadenopathy:     Cervical: No cervical adenopathy.  Skin:    General: Skin is warm and dry.  Neurological:     Mental Status: She is alert.     Assessment/Plan  High risk screening colonoscopy.  Hildred Laser, MD 01/14/2021, 8:41 AM

## 2021-01-14 NOTE — Anesthesia Postprocedure Evaluation (Signed)
Anesthesia Post Note  Patient: Elizabeth Lopez  Procedure(s) Performed: COLONOSCOPY WITH PROPOFOL  Patient location during evaluation: Phase II Anesthesia Type: General Level of consciousness: awake Pain management: pain level controlled Vital Signs Assessment: post-procedure vital signs reviewed and stable Respiratory status: spontaneous breathing and respiratory function stable Cardiovascular status: blood pressure returned to baseline and stable Postop Assessment: no headache and no apparent nausea or vomiting Anesthetic complications: no Comments: Late entry   No notable events documented.   Last Vitals:  Vitals:   01/14/21 0727 01/14/21 0910  BP: 109/82 113/70  Pulse: 76 72  Resp: 16 19  Temp: 36.5 C 36.9 C  SpO2: 100% 100%    Last Pain:  Vitals:   01/14/21 0910  TempSrc: Oral  PainSc: 0-No pain                 Louann Sjogren

## 2021-01-14 NOTE — Transfer of Care (Signed)
Immediate Anesthesia Transfer of Care Note  Patient: Elizabeth Lopez  Procedure(s) Performed: COLONOSCOPY WITH PROPOFOL  Patient Location: Short Stay  Anesthesia Type:General  Level of Consciousness: awake and alert   Airway & Oxygen Therapy: Patient Spontanous Breathing  Post-op Assessment: Report given to RN and Post -op Vital signs reviewed and stable  Post vital signs: Reviewed and stable  Last Vitals:  Vitals Value Taken Time  BP    Temp    Pulse    Resp    SpO2      Last Pain:  Vitals:   01/14/21 0843  TempSrc:   PainSc: 0-No pain         Complications: No notable events documented.

## 2021-01-14 NOTE — Discharge Instructions (Addendum)
Resume usual medications as before. High-fiber diet. No driving for 24 hours. Next colonoscopy in 5 years.    

## 2021-01-15 ENCOUNTER — Ambulatory Visit (HOSPITAL_COMMUNITY)
Admission: RE | Admit: 2021-01-15 | Discharge: 2021-01-15 | Disposition: A | Discharge status: Home / Self Care | Payer: 59 | Source: Ambulatory Visit | Attending: Internal Medicine | Admitting: Internal Medicine

## 2021-01-15 ENCOUNTER — Inpatient Hospital Stay
Admission: RE | Admit: 2021-01-15 | Discharge: 2021-01-15 | Disposition: A | Discharge status: Home / Self Care | Payer: Self-pay | Source: Ambulatory Visit | Attending: Internal Medicine | Admitting: Internal Medicine

## 2021-01-15 ENCOUNTER — Other Ambulatory Visit (HOSPITAL_COMMUNITY): Payer: Self-pay | Admitting: Internal Medicine

## 2021-01-15 ENCOUNTER — Other Ambulatory Visit: Payer: Self-pay

## 2021-01-15 DIAGNOSIS — R928 Other abnormal and inconclusive findings on diagnostic imaging of breast: Secondary | ICD-10-CM

## 2021-01-15 LAB — COMPREHENSIVE METABOLIC PANEL
Albumin: 4.4 (ref 3.5–5.0)
Calcium: 9.8 (ref 8.7–10.7)

## 2021-01-15 LAB — HEPATIC FUNCTION PANEL
ALT: 8 (ref 7–35)
AST: 20 (ref 13–35)
Alkaline Phosphatase: 122 (ref 25–125)
Bilirubin, Total: 0.2

## 2021-01-15 LAB — TSH: TSH: 0.79 (ref 0.41–5.90)

## 2021-01-15 LAB — BASIC METABOLIC PANEL
BUN: 8 (ref 4–21)
CO2: 24 — AB (ref 13–22)
Chloride: 100 (ref 99–108)
Creatinine: 0.6 (ref 0.5–1.1)
Glucose: 93
Potassium: 4.7 (ref 3.4–5.3)
Sodium: 136 — AB (ref 137–147)

## 2021-01-15 LAB — HEMOGLOBIN A1C: Hemoglobin A1C: 6

## 2021-01-15 LAB — LIPID PANEL
Cholesterol: 171 (ref 0–200)
HDL: 59 (ref 35–70)
LDL Cholesterol: 102
LDl/HDL Ratio: 2.9
Triglycerides: 48 (ref 40–160)

## 2021-01-16 ENCOUNTER — Encounter (HOSPITAL_COMMUNITY): Payer: Self-pay | Admitting: Internal Medicine

## 2021-01-28 ENCOUNTER — Encounter: Payer: Self-pay | Admitting: "Endocrinology

## 2021-06-09 ENCOUNTER — Other Ambulatory Visit: Payer: Self-pay | Admitting: "Endocrinology

## 2021-07-02 ENCOUNTER — Other Ambulatory Visit: Payer: Self-pay

## 2021-07-02 DIAGNOSIS — E890 Postprocedural hypothyroidism: Secondary | ICD-10-CM

## 2021-07-09 ENCOUNTER — Ambulatory Visit: Payer: 59 | Admitting: "Endocrinology

## 2021-07-22 LAB — BASIC METABOLIC PANEL
BUN: 13 (ref 4–21)
CO2: 25 — AB (ref 13–22)
Chloride: 101 (ref 99–108)
Creatinine: 0.9 (ref 0.5–1.1)
Glucose: 90
Potassium: 4.6 mEq/L (ref 3.5–5.1)
Sodium: 139 (ref 137–147)

## 2021-07-22 LAB — TSH: TSH: 2.73 (ref 0.41–5.90)

## 2021-07-22 LAB — HEPATIC FUNCTION PANEL
ALT: 13 U/L (ref 7–35)
AST: 22 (ref 13–35)
Alkaline Phosphatase: 102 (ref 25–125)
Bilirubin, Total: 0.3

## 2021-07-22 LAB — LIPID PANEL
Cholesterol: 186 (ref 0–200)
HDL: 61 (ref 35–70)
LDL Cholesterol: 112
Triglycerides: 72 (ref 40–160)

## 2021-07-22 LAB — COMPREHENSIVE METABOLIC PANEL
Albumin: 4.6 (ref 3.5–5.0)
Calcium: 9.8 (ref 8.7–10.7)
Globulin: 2.8

## 2021-07-22 LAB — HEMOGLOBIN A1C: Hemoglobin A1C: 6.1

## 2021-08-11 ENCOUNTER — Ambulatory Visit: Payer: 59 | Admitting: "Endocrinology

## 2021-08-11 ENCOUNTER — Telehealth: Payer: Self-pay | Admitting: "Endocrinology

## 2021-08-11 NOTE — Telephone Encounter (Signed)
Pt came in at 3:08 for a 3pm appt. Patient got irritated because she claimed Dr Dorris Fetch was running behind, which he was not. Patient kept jumping in front of patients to tell me she was leaving and to keep her copay. I told patient that I had to refund her money. Patient left anyway. Patient paid with cash so therefore she needed to stay to get her cash back. I called patient and told her she had to come back and pick up her money. Patient kept refusing and saying she was halfway to [place] and she could not turn around. I advised patient she must pick up this money as soon as possible as we are not responsible for her cash. Pt said she would come first thing on 6/14

## 2021-09-10 ENCOUNTER — Ambulatory Visit (INDEPENDENT_AMBULATORY_CARE_PROVIDER_SITE_OTHER): Payer: 59 | Admitting: "Endocrinology

## 2021-09-10 ENCOUNTER — Encounter: Payer: Self-pay | Admitting: "Endocrinology

## 2021-09-10 VITALS — BP 100/68 | HR 84 | Ht 72.0 in | Wt 195.2 lb

## 2021-09-10 DIAGNOSIS — R7303 Prediabetes: Secondary | ICD-10-CM | POA: Insufficient documentation

## 2021-09-10 DIAGNOSIS — E890 Postprocedural hypothyroidism: Secondary | ICD-10-CM

## 2021-09-10 DIAGNOSIS — E782 Mixed hyperlipidemia: Secondary | ICD-10-CM | POA: Diagnosis not present

## 2021-09-10 MED ORDER — ROSUVASTATIN CALCIUM 10 MG PO TABS: 10.0000 mg | ORAL_TABLET | Freq: Every day | ORAL | 1 refills | Status: DC

## 2021-09-10 MED ORDER — LEVOTHYROXINE SODIUM 137 MCG PO TABS: 137.0000 ug | ORAL_TABLET | Freq: Every day | ORAL | 1 refills | Status: DC

## 2021-09-10 NOTE — Progress Notes (Signed)
09/10/2021      Endocrinology follow-up note   Subjective:    Patient ID: Elizabeth Lopez, female    DOB: 06-24-1955, PCP Celene Squibb, MD   Past Medical History:  Diagnosis Date   Anxiety    Chronic constipation    Family hx of colon cancer    GERD (gastroesophageal reflux disease)    Hyperlipidemia    Hypothyroidism    Past Surgical History:  Procedure Laterality Date   COLONOSCOPY     COLONOSCOPY  02/12/2011   Procedure: COLONOSCOPY;  Surgeon: Rogene Houston, MD;  [facility];  Service: Endoscopy;  Laterality: N/A;  9:45   COLONOSCOPY N/A 02/12/2016   Procedure: COLONOSCOPY;  Surgeon: Rogene Houston, MD;  [facility];  Service: Endoscopy;  Laterality: N/A;  730   COLONOSCOPY WITH PROPOFOL N/A 01/14/2021   Procedure: COLONOSCOPY WITH PROPOFOL;  Surgeon: Rogene Houston, MD;  [facility];  Service: Endoscopy;  Laterality: N/A;  8:30   ESOPHAGOGASTRODUODENOSCOPY N/A 10/05/2013   Procedure: ESOPHAGOGASTRODUODENOSCOPY (EGD);  Surgeon: Rogene Houston, MD;  [facility];  Service: Endoscopy;  Laterality: N/A;  1200-rescheduled to [place] notified pt   knot     patient had a knot removed from right wrisit 9 years ago.   LASIK     WRIST GANGLION EXCISION     right   Social History   Socioeconomic History   Marital status: Married    Spouse name: Not on file   Number of children: Not on file   Years of education: Not on file   Highest education level: Not on file  Occupational History   Not on file  Tobacco Use   Smoking status: Never   Smokeless tobacco: Never  Vaping Use   Vaping Use: Never used  Substance and Sexual Activity   Alcohol use: Yes    Comment: occasional wine   Drug use: No   Sexual activity: Not on file  Other Topics Concern   Not on file  Social History Narrative   Not on file   Social Determinants of Health   Financial Resource Strain: Not on file  Food Insecurity: Not on file  Transportation  Needs: Not on file  Physical Activity: Not on file  Stress: Not on file  Social Connections: Not on file   Outpatient Encounter Medications as of 09/10/2021  Medication Sig   ALPRAZolam (XANAX) 0.5 MG tablet Take 0.5 mg by mouth 2 (two) times daily as needed for anxiety.   Cholecalciferol (VITAMIN D PO) Take 1 tablet by mouth daily. (Patient not taking: Reported on 09/10/2021)   COD LIVER OIL PO Take 1 capsule by mouth daily.   levothyroxine (SYNTHROID) 137 MCG tablet Take 1 tablet (137 mcg total) by mouth daily before breakfast.   omeprazole (PRILOSEC) 20 MG capsule TAKE ONE CAPSULE ('20MG'$  TOTAL) BY MOUTH DAILY BEFORE BREAKFAST   phentermine (ADIPEX-P) 37.5 MG tablet Take 37.5 mg by mouth every Monday.   rosuvastatin (CRESTOR) 10 MG tablet Take 1 tablet (10 mg total) by mouth daily.   vitamin B-12 (CYANOCOBALAMIN) 500 MCG tablet Take 500 mcg by mouth daily.     [DISCONTINUED] levothyroxine (SYNTHROID) 137 MCG tablet Take 1 tablet (137 mcg total) by mouth daily before breakfast.   [DISCONTINUED] rosuvastatin (CRESTOR) 10 MG tablet Take 1 tablet (10 mg total) by mouth daily.   No facility-administered encounter medications on file as of 09/10/2021.   ALLERGIES: No Known Allergies  VACCINATION STATUS: Immunization History  Administered Date(s) Administered   Influenza-Unspecified 12/01/2015   Moderna Sars-Covid-2 Vaccination 04/23/2019, 05/22/2019, 01/03/2020    HPI  66 yo female with with Graves disease status po I131 therapy on Aril 2012. She remains on Synthroid 137 mcg p.o. daily before breakfast.  Her previsit labs show controlled thyroid hormones, however still significant dyslipidemia and prediabetes.     She reports compliance to her medication including Synthroid and Crestor.    She is also on Crestor 10 mg p.o. nightly for hyperlipidemia.   -She has fluctuating body weight, presents with a gain of 6 pounds since last visit.   -She denies palpitations, tremors, heat  intolerance.   Review of Systems  Limited as above.  Objective:    BP 100/68   Pulse 84   Ht 6' (1.829 m)   Wt 195 lb 3.2 oz (88.5 kg)   LMP 07/30/2016 (Approximate)   BMI 26.47 kg/m   Wt Readings from Last 3 Encounters:  09/10/21 195 lb 3.2 oz (88.5 kg)  09/16/20 189 lb (85.7 kg)  07/09/20 196 lb (88.9 kg)    Physical Exam   Results for orders placed or performed in visit on 13/24/40  Basic metabolic panel  Result Value Ref Range   Glucose 90    BUN 13 4 - 21   CO2 25 (A) 13 - 22   Creatinine 0.9 0.5 - 1.1   Potassium 4.6 3.5 - 5.1 mEq/L   Sodium 139 137 - 147   Chloride 101 99 - 108  Comprehensive metabolic panel  Result Value Ref Range   Globulin 2.8    Calcium 9.8 8.7 - 10.7   Albumin 4.6 3.5 - 5.0  Lipid panel  Result Value Ref Range   Triglycerides 72 40 - 160   Cholesterol 186 0 - 200   HDL 61 35 - 70   LDL Cholesterol 112   Hepatic function panel  Result Value Ref Range   Alkaline Phosphatase 102 25 - 125   ALT 13 7 - 35 U/L   AST 22 13 - 35   Bilirubin, Total 0.3   Hemoglobin A1c  Result Value Ref Range   Hemoglobin A1C 6.1   TSH  Result Value Ref Range   TSH 2.73 0.41 - 5.90   Complete Blood Count (Most recent): Lab Results  Component Value Date   WBC 7.1 11/10/2018   HGB 11.8 (A) 06/16/2010   HCT 42 (A) 11/10/2018   MCV 90 11/10/2018   PLT 279 06/16/2010   Chemistry (most recent): Lab Results  Component Value Date   NA 139 07/22/2021   K 4.6 07/22/2021   CL 101 07/22/2021   CO2 25 (A) 07/22/2021   BUN 13 07/22/2021   CREATININE 0.9 07/22/2021     Assessment & Plan:   RAI induced hypothyroidism, hyperlipidemia, prediabetes  Hypothyroidism due to RAI   - Her visit thyroid function tests are consistent with appropriate replacement.  She is advised to continue Synthroid 137 mcg p.o. daily before breakfast.     - We discussed about the correct intake of her thyroid hormone, on empty stomach at fasting, with water, separated by  at least 30 minutes from breakfast and other medications,  and separated by more than 4 hours from calcium, iron, multivitamins, acid reflux medications (PPIs). -Patient is made aware of the fact that thyroid hormone replacement is needed for life, dose to be adjusted by periodic monitoring of thyroid function tests.  Hyperlipidemia: She was kept on Crestor 10 mg p.o. nightly. Her LDL is worsening to 112 from 102.  She remains on Crestor 10 mg p.o. daily nightly.    In light of her hyperlipidemia, and prediabetes, she is a candidate for lifestyle medicine.    - she acknowledges that there is a room for improvement in her food and drink choices. - Suggestion is made for her to avoid simple carbohydrates  from her diet including Cakes, Sweet Desserts, Ice Cream, Soda (diet and regular), Sweet Tea, Candies, Chips, Cookies, Store Bought Juices, Alcohol , Artificial Sweeteners,  Coffee Creamer, and "Sugar-free" Products, Lemonade. This will help patient to have more stable blood glucose profile and potentially avoid unintended weight gain.  The following Lifestyle Medicine recommendations according to [facility]) were discussed and and offered to patient and she  agrees to start the journey:  A. Whole Foods, Plant-Based Nutrition comprising of fruits and vegetables, plant-based proteins, whole-grain carbohydrates was discussed in detail with the patient.   A list for source of those nutrients were also provided to the patient.  Patient will use only water or unsweetened tea for hydration. B.  The need to stay away from risky substances including alcohol, smoking; obtaining 7 to 9 hours of restorative sleep, at least 150 minutes of moderate intensity exercise weekly, the importance of healthy social connections,  and stress management techniques were discussed. C.  A full color page of  Calorie density of various food groups per pound showing examples of each food  groups was provided to the patient.   - I advised patient to maintain close follow up with Celene Squibb, MD for primary care needs.                                                 She is advised to maintain close follow-up with her PCP.                      I spent 30 minutes in the care of the patient today including review of labs from Thyroid Function, CMP, and other relevant labs ; imaging/biopsy records (current and previous including abstractions from other facilities); face-to-face time discussing  her lab results and symptoms, medications doses, her options of short and long term treatment based on the latest standards of care / guidelines;   and documenting the encounter.  Elizabeth Lopez  participated in the discussions, expressed understanding, and voiced agreement with the above plans.  All questions were answered to her satisfaction. she is encouraged to contact clinic should she have any questions or concerns prior to her return visit.   Follow up plan: Return in about 6 months (around 03/13/2022) for F/U with Pre-visit Labs, A1c -NV.  Glade Lloyd, MD Phone: [phone]  Fax: [phone]  -  This note was partially dictated with voice recognition software. Similar sounding words can be transcribed inadequately or may not  be corrected upon review.  09/10/2021, 1:19 PM

## 2021-09-10 NOTE — Patient Instructions (Signed)

## 2021-11-19 ENCOUNTER — Encounter (INDEPENDENT_AMBULATORY_CARE_PROVIDER_SITE_OTHER): Payer: Self-pay | Admitting: Gastroenterology

## 2021-12-08 ENCOUNTER — Other Ambulatory Visit (INDEPENDENT_AMBULATORY_CARE_PROVIDER_SITE_OTHER): Payer: Self-pay | Admitting: Internal Medicine

## 2021-12-10 ENCOUNTER — Ambulatory Visit (INDEPENDENT_AMBULATORY_CARE_PROVIDER_SITE_OTHER): Payer: 59 | Admitting: Gastroenterology

## 2021-12-18 ENCOUNTER — Other Ambulatory Visit (HOSPITAL_COMMUNITY): Payer: Self-pay | Admitting: Internal Medicine

## 2021-12-18 DIAGNOSIS — Z1231 Encounter for screening mammogram for malignant neoplasm of breast: Secondary | ICD-10-CM

## 2021-12-22 IMAGING — MG MM DIGITAL DIAGNOSTIC UNILAT*L* W/ TOMO W/ CAD
4 series · 4 of 12 positions shown · non-contrast
Comparison: Previous exams.

CLINICAL DATA: Screening recall for left breast asymmetry.

EXAM:
DIGITAL DIAGNOSTIC UNILATERAL LEFT MAMMOGRAM WITH TOMOSYNTHESIS AND
CAD
TECHNIQUE: Left digital diagnostic mammography and breast tomosynthesis was
performed. The images were evaluated with computer-aided detection.

[L MLO synth-2D]
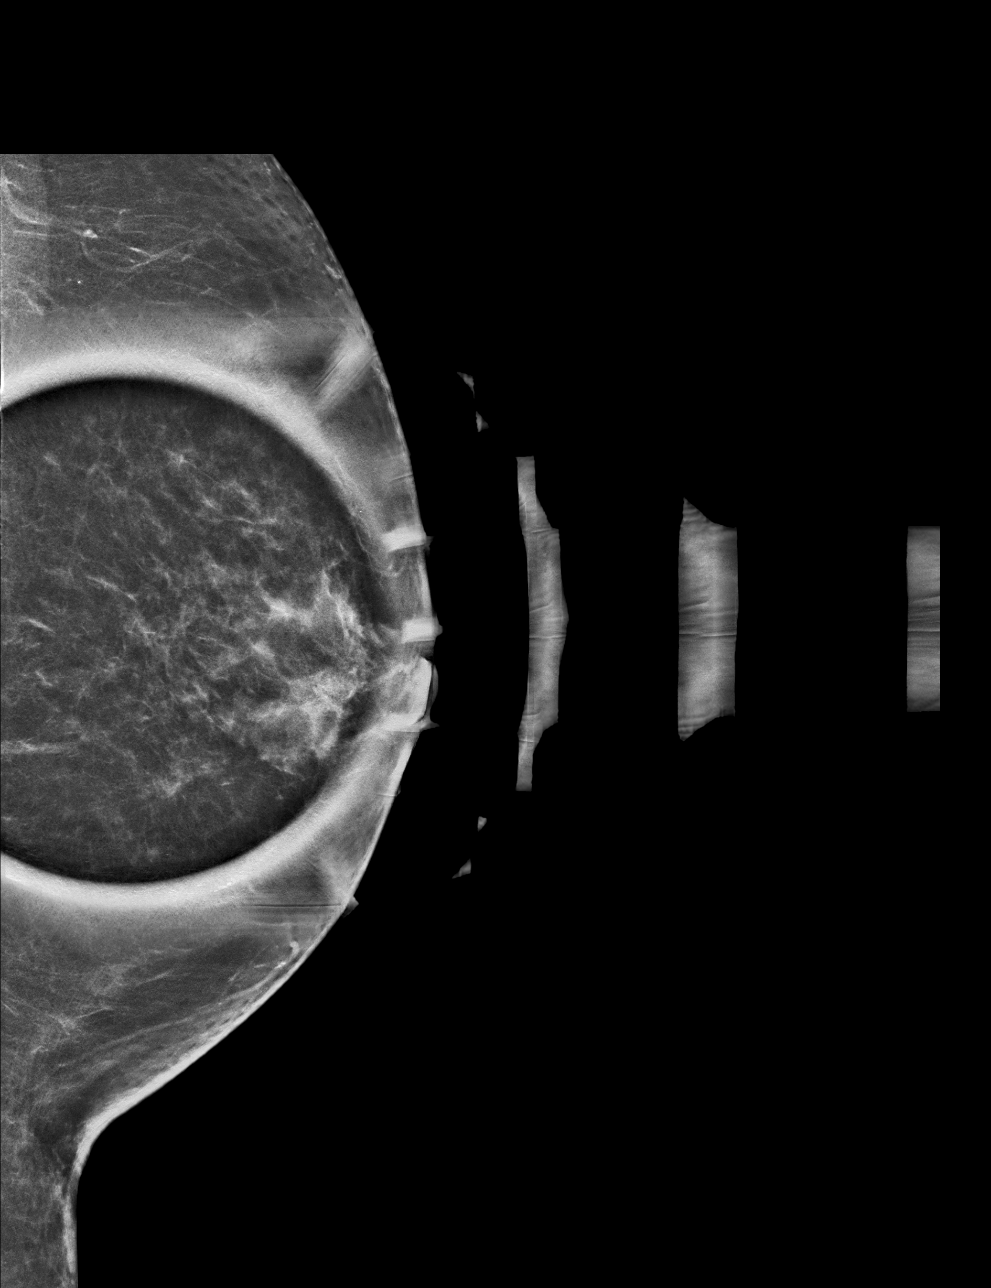

[L ML synth-2D]
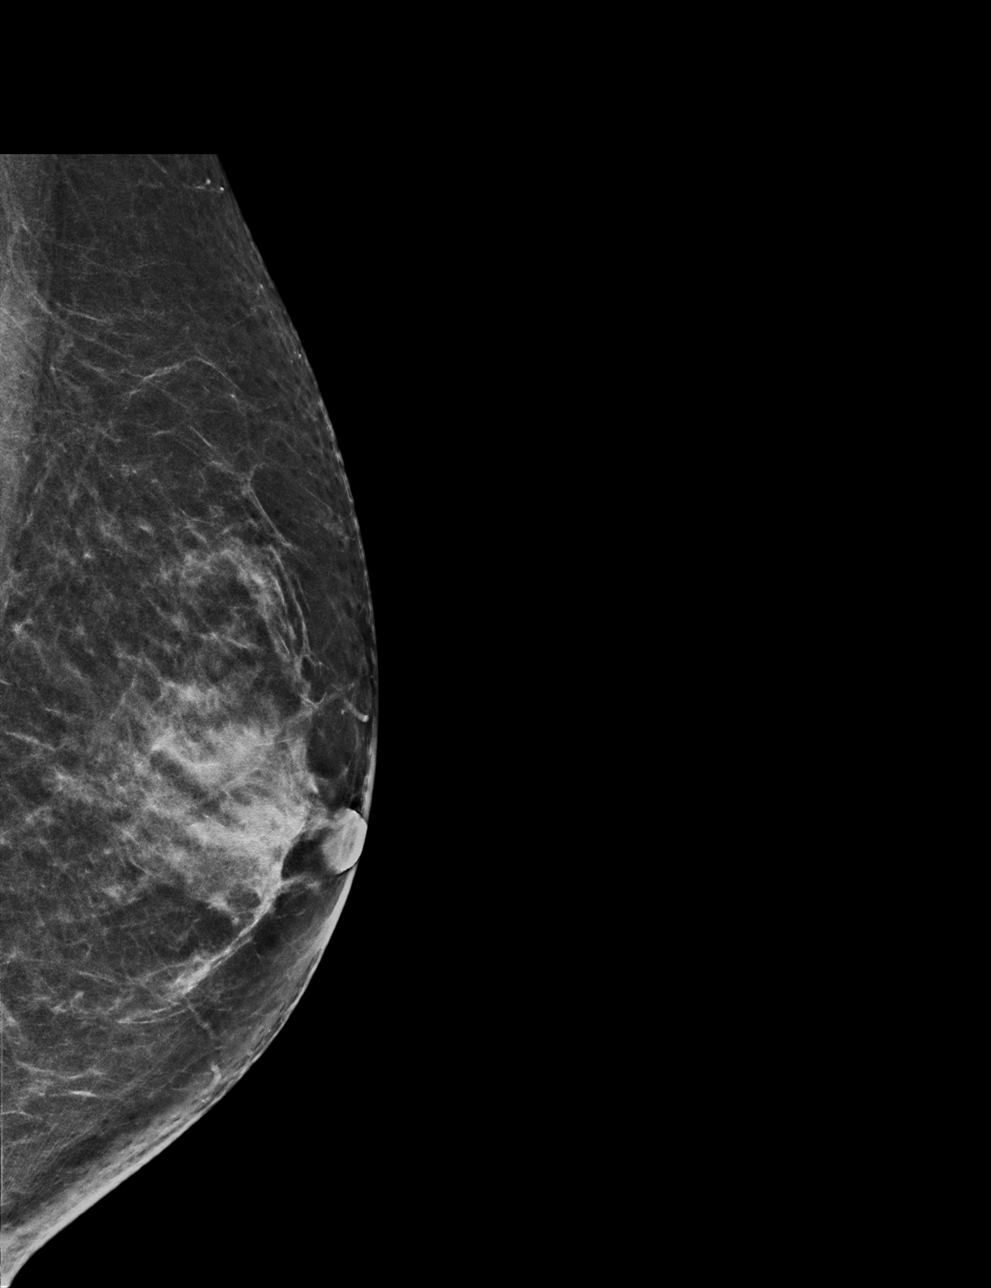

[L MLO tomo · tomo slice 29/58.0]
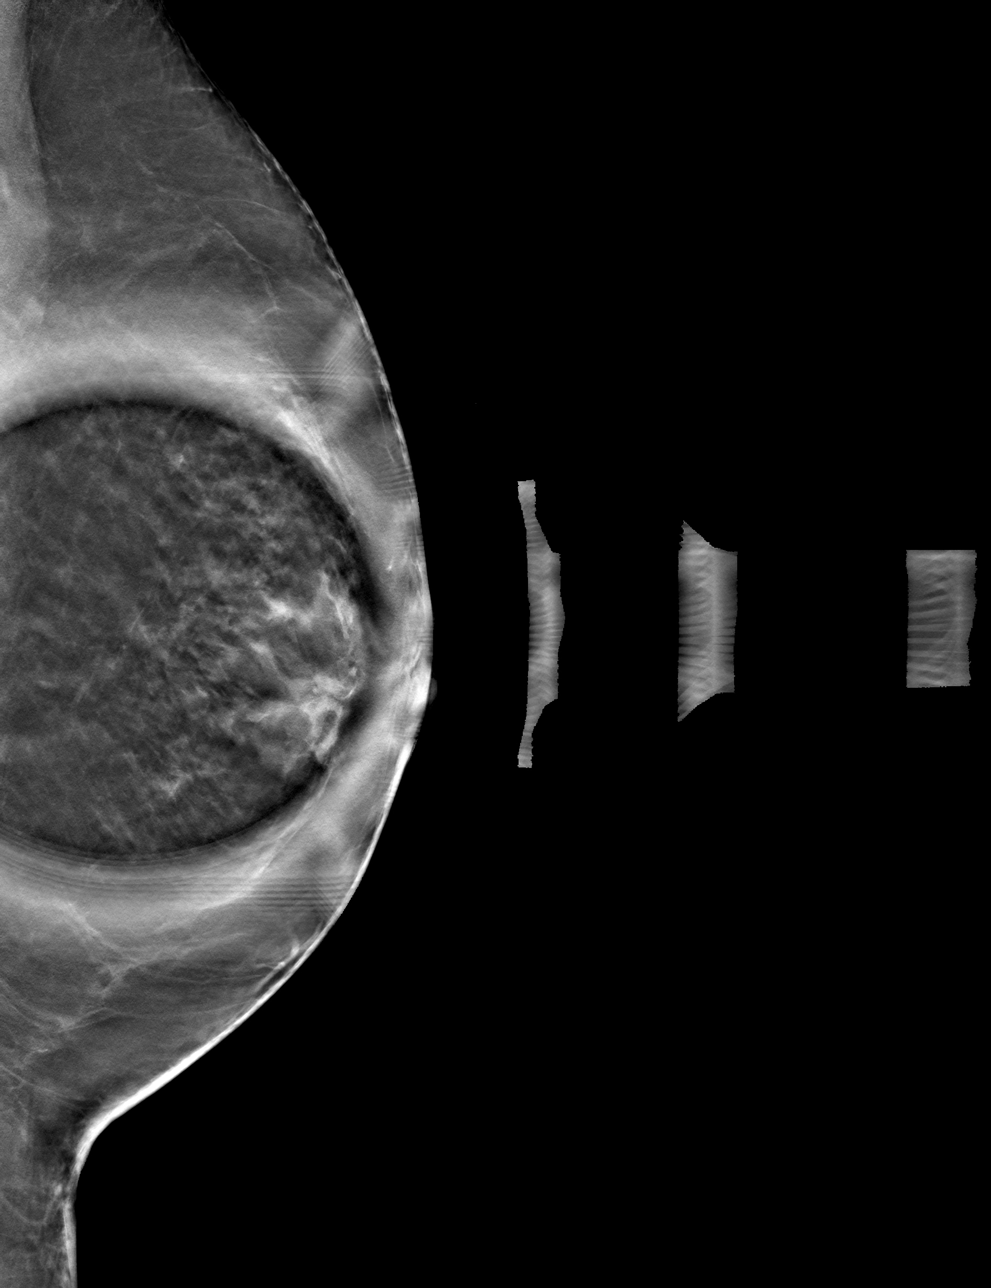

[L ML tomo · tomo slice 30/59.0]
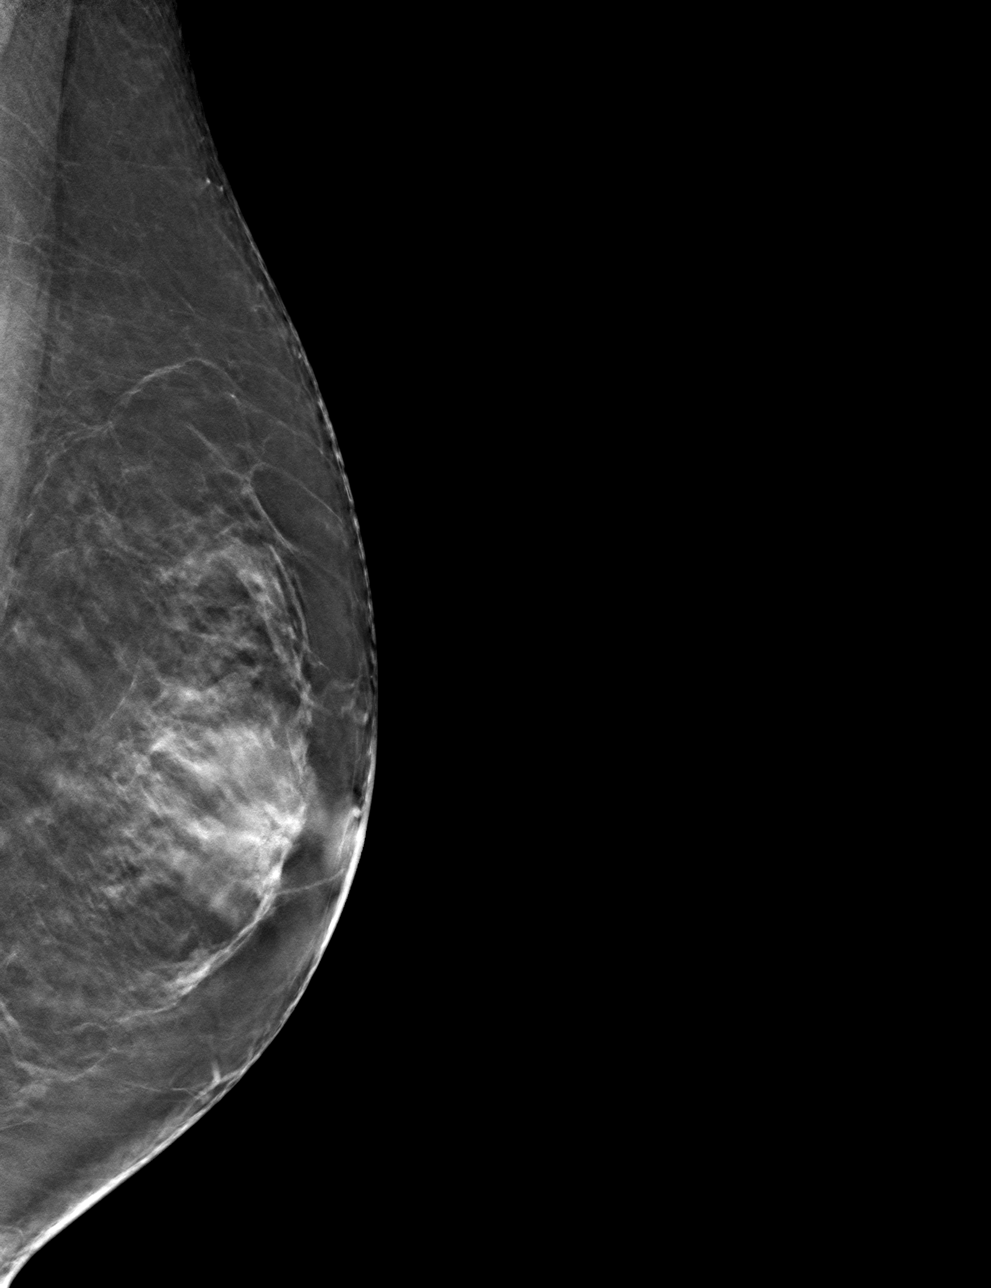

[4 of 12 positions shown; findings below may reference images not displayed]

ACR Breast Density Category c: The breast tissue is heterogeneously
dense, which may obscure small masses.
FINDINGS: Additional tomograms were performed of the left breast. The
initially questioned possible left breast asymmetry resolves on the
additional imaging with findings compatible with areas of
overlapping fibroglandular tissue. There is no mammographic evidence
of malignancy in the left breast.
IMPRESSION: No mammographic evidence of malignancy in the left breast.

RECOMMENDATION:
Screening mammogram in one year.(Code:[id number]-9VJ)

I have discussed the findings and recommendations with the patient.
If applicable, a reminder letter will be sent to the patient
regarding the next appointment.

BI-RADS CATEGORY  2: Benign.

## 2021-12-24 LAB — LIPID PANEL
Cholesterol: 189 (ref 0–200)
HDL: 64 (ref 35–70)
LDL Cholesterol: 113
Triglycerides: 66 (ref 40–160)

## 2021-12-24 LAB — HEPATIC FUNCTION PANEL
ALT: 10 U/L (ref 7–35)
AST: 21 (ref 13–35)
Alkaline Phosphatase: 105 (ref 25–125)
Bilirubin, Total: 0.3

## 2021-12-24 LAB — BASIC METABOLIC PANEL
BUN: 12 (ref 4–21)
CO2: 23 — AB (ref 13–22)
Chloride: 101 (ref 99–108)
Creatinine: 0.8 (ref 0.5–1.1)
Glucose: 92
Potassium: 4.5 mEq/L (ref 3.5–5.1)
Sodium: 138 (ref 137–147)

## 2021-12-24 LAB — HEMOGLOBIN A1C: Hemoglobin A1C: 6.2

## 2021-12-24 LAB — COMPREHENSIVE METABOLIC PANEL
Albumin: 4.3 (ref 3.5–5.0)
Calcium: 9.5 (ref 8.7–10.7)
Globulin: 2.8

## 2021-12-24 LAB — TSH: TSH: 0.91 (ref 0.41–5.90)

## 2022-01-08 ENCOUNTER — Other Ambulatory Visit: Payer: Self-pay | Admitting: "Endocrinology

## 2022-01-17 ENCOUNTER — Encounter (INDEPENDENT_AMBULATORY_CARE_PROVIDER_SITE_OTHER): Payer: Self-pay | Admitting: Gastroenterology

## 2022-01-18 ENCOUNTER — Ambulatory Visit (HOSPITAL_COMMUNITY): Payer: 59

## 2022-01-25 ENCOUNTER — Ambulatory Visit (HOSPITAL_COMMUNITY)
Admission: RE | Admit: 2022-01-25 | Discharge: 2022-01-25 | Disposition: A | Discharge status: Home / Self Care | Payer: 59 | Source: Ambulatory Visit | Attending: Internal Medicine | Admitting: Internal Medicine

## 2022-01-25 DIAGNOSIS — Z1231 Encounter for screening mammogram for malignant neoplasm of breast: Secondary | ICD-10-CM | POA: Insufficient documentation

## 2022-02-01 ENCOUNTER — Ambulatory Visit (INDEPENDENT_AMBULATORY_CARE_PROVIDER_SITE_OTHER): Payer: 59 | Admitting: Gastroenterology

## 2022-02-01 ENCOUNTER — Encounter (INDEPENDENT_AMBULATORY_CARE_PROVIDER_SITE_OTHER): Payer: Self-pay | Admitting: Gastroenterology

## 2022-02-01 VITALS — BP 129/81 | HR 71 | Temp 97.8°F | Ht 72.0 in | Wt 199.4 lb

## 2022-02-01 DIAGNOSIS — K219 Gastro-esophageal reflux disease without esophagitis: Secondary | ICD-10-CM

## 2022-02-01 MED ORDER — OMEPRAZOLE 20 MG PO CPDR: 20.0000 mg | DELAYED_RELEASE_CAPSULE | Freq: Every day | ORAL | 3 refills | Status: DC

## 2022-02-01 NOTE — Progress Notes (Signed)
Elizabeth Lopez, M.D. Gastroenterology & Hepatology [facility] [address] [place], [place] [address]  Primary Care Physician: Elizabeth Squibb, MD [address] [id number]  I will communicate my assessment and recommendations to the referring MD via EMR.  Problems: GERD Family history colon cancer  History of Present Illness: Elizabeth Lopez is a 66 y.o. female with past medical history of anxiety, GERD, hyperlipidemia, hypothyroidism, who presents for follow up of GERD.  The patient was last seen on 09/16/20. At that time, the patient was advised to continue omeprazole 20 mg every other day and take famotidine as needed.  She was scheduled for a colonoscopy with findings described below.  Patient reports she takes omeprazole 20 mg at night compliantly. This controls her symptoms as she does not have any dysphagia, odynophagia or heartburn sensation. Has tolerated her medication.  The patient denies having any nausea, vomiting, fever, chills, hematochezia, melena, hematemesis, abdominal distention, abdominal pain, diarrhea, jaundice, pruritus or weight loss.  Last EGD: July 2015 revealing small sliding hiatal hernia. Mucosa of esophagus revealed some granularity but biopsies revealed no abnormality.   Last Colonoscopy: 12/2020 Diverticulosis in the distal sigmoid colon. - External hemorrhoids.  Recommended repeat in 5 years  Past Medical History: Past Medical History:  Diagnosis Date   Anxiety    Chronic constipation    Family hx of colon cancer    GERD (gastroesophageal reflux disease)    Hyperlipidemia    Hypothyroidism     Past Surgical History: Past Surgical History:  Procedure Laterality Date   COLONOSCOPY     COLONOSCOPY  02/12/2011   Procedure: COLONOSCOPY;  Surgeon: Rogene Houston, MD;  [facility];  Service: Endoscopy;  Laterality: N/A;  9:45   COLONOSCOPY N/A 02/12/2016   Procedure:  COLONOSCOPY;  Surgeon: Rogene Houston, MD;  [facility];  Service: Endoscopy;  Laterality: N/A;  730   COLONOSCOPY WITH PROPOFOL N/A 01/14/2021   Procedure: COLONOSCOPY WITH PROPOFOL;  Surgeon: Rogene Houston, MD;  [facility];  Service: Endoscopy;  Laterality: N/A;  8:30   ESOPHAGOGASTRODUODENOSCOPY N/A 10/05/2013   Procedure: ESOPHAGOGASTRODUODENOSCOPY (EGD);  Surgeon: Rogene Houston, MD;  [facility];  Service: Endoscopy;  Laterality: N/A;  1200-rescheduled to [place] notified pt   knot     patient had a knot removed from right wrisit 9 years ago.   LASIK     WRIST GANGLION EXCISION     right    Family History: Family History  Problem Relation Age of Onset   Colon cancer Mother    Cancer Mother    Healthy Sister    Healthy Brother    Healthy Sister    Healthy Sister    Healthy Brother    Healthy Brother    Cancer Father     Social History: Social History   Tobacco Use  Smoking Status Never  Smokeless Tobacco Never   Social History   Substance and Sexual Activity  Alcohol Use Yes   Comment: occasional wine   Social History   Substance and Sexual Activity  Drug Use No    Allergies: No Known Allergies  Medications: Current Outpatient Medications  Medication Sig Dispense Refill   ALPRAZolam (XANAX) 0.5 MG tablet Take 0.5 mg by mouth 2 (two) times daily as needed for anxiety.     Cholecalciferol (VITAMIN D PO) Take 1 tablet by mouth daily.  COD LIVER OIL PO Take 1 capsule by mouth daily.     ELDERBERRY PO Take by mouth daily at 6 (six) AM.     levothyroxine (SYNTHROID) 137 MCG tablet TAKE 1 TABLET BY MOUTH DAILY BEFORE BREAKFAST. 90 tablet 0   omeprazole (PRILOSEC) 20 MG capsule TAKE ONE CAPSULE ('20MG'$  TOTAL) BY MOUTH DAILY BEFORE BREAKFAST 90 capsule 0   rosuvastatin (CRESTOR) 10 MG tablet Take 1 tablet (10 mg total) by mouth daily. 90 tablet 1   vitamin B-12 (CYANOCOBALAMIN) 500 MCG tablet Take 500 mcg by mouth daily.        No current facility-administered medications for this visit.    Review of Systems: GENERAL: negative for malaise, night sweats HEENT: No changes in hearing or vision, no nose bleeds or other nasal problems. NECK: Negative for lumps, goiter, pain and significant neck swelling RESPIRATORY: Negative for cough, wheezing CARDIOVASCULAR: Negative for chest pain, leg swelling, palpitations, orthopnea GI: SEE HPI MUSCULOSKELETAL: Negative for joint pain or swelling, back pain, and muscle pain. SKIN: Negative for lesions, rash PSYCH: Negative for sleep disturbance, mood disorder and recent psychosocial stressors. HEMATOLOGY Negative for prolonged bleeding, bruising easily, and swollen nodes. ENDOCRINE: Negative for cold or heat intolerance, polyuria, polydipsia and goiter. NEURO: negative for tremor, gait imbalance, syncope and seizures. The remainder of the review of systems is noncontributory.   Physical Exam: BP 129/81 (BP Location: Left Arm, Patient Position: Sitting, Cuff Size: Small)   Pulse 71   Temp 97.8 F (36.6 C) (Temporal)   Ht 6' (1.829 m)   Wt 199 lb 6.4 oz (90.4 kg)   LMP 07/30/2016 (Approximate)   BMI 27.04 kg/m  GENERAL: The patient is AO x3, in no acute distress. HEENT: Head is normocephalic and atraumatic. EOMI are intact. Mouth is well hydrated and without lesions. NECK: Supple. No masses LUNGS: Clear to auscultation. No presence of rhonchi/wheezing/rales. Adequate chest expansion HEART: RRR, normal s1 and s2. ABDOMEN: Soft, nontender, no guarding, no peritoneal signs, and nondistended. BS +. No masses. EXTREMITIES: Without any cyanosis, clubbing, rash, lesions or edema. NEUROLOGIC: AOx3, no focal motor deficit. SKIN: no jaundice, no rashes  Imaging/Labs: as above  I personally reviewed and interpreted the available labs, imaging and endoscopic files.  Impression and Plan: Elizabeth Lopez is a 66 y.o. female with past medical history of anxiety, GERD,  hyperlipidemia, hypothyroidism, who presents for follow up of GERD.  The patient has been asymptomatic while taking omeprazole 20 mg daily and has tolerated the medication adequately.  We had an extensive discussion regarding the potential benefits from other nonpharmacologic measures to treat reflux and the possibility of being off medications if she undergoes this type of procedure.  For now, the patient will continue with omeprazole 20 mg daily.  Educational material was provided.  - Continue omeprazole 20 mg qday - The patient and I held a thorough discussion about potential nonpharmacologic treatments for reflux which include Transoral Incisionless Fundoplication (TIF).  The benefits and risks, as well as prognosis with the use of the different modalities was thoroughly discussed with the patient who understood and agreed.  The patient will read more about this procedure and will let me know if interested to pursue this in the future.  All questions were answered.      Elizabeth Peppers, MD Gastroenterology and Hepatology [facility]

## 2022-02-01 NOTE — Patient Instructions (Signed)
Continue omeprazole 20 mg qday The patient and I held a thorough discussion about potential nonpharmacologic treatments for reflux which include Transoral Incisionless Fundoplication (TIF) a.  The benefits and risks, as well as prognosis with the use of the different modalities was thoroughly discussed with the patient who understood and agreed.  The patient will read more about this procedure and will let me know if interested to pursue this in the future.

## 2022-03-08 ENCOUNTER — Telehealth: Payer: Self-pay | Admitting: "Endocrinology

## 2022-03-08 NOTE — Telephone Encounter (Signed)
Spoke with pt, made her aware we can check her HgbA1c here in the office during her visit. Understanding voiced.

## 2022-03-08 NOTE — Telephone Encounter (Signed)
Pt left a VM that she did her lab work and she had them pull an extra tube of blood. She wants you to call [facility] and get them to run the A1C.

## 2022-03-09 LAB — T4, FREE: Free T4: 1.23 ng/dL (ref 0.82–1.77)

## 2022-03-09 LAB — LIPID PANEL
Chol/HDL Ratio: 3.1 ratio (ref 0.0–4.4)
Cholesterol, Total: 222 mg/dL — ABNORMAL HIGH (ref 100–199)
HDL: 72 mg/dL (ref >39)
LDL Chol Calc (NIH): 138 mg/dL — ABNORMAL HIGH (ref 0–99)
Triglycerides: 68 mg/dL (ref 0–149)
VLDL Cholesterol Cal: 12 mg/dL (ref 5–40)

## 2022-03-09 LAB — COMPREHENSIVE METABOLIC PANEL
ALT: 14 IU/L (ref 0–32)
AST: 24 IU/L (ref 0–40)
Albumin/Globulin Ratio: 1.3 (ref 1.2–2.2)
Albumin: 4.3 g/dL (ref 3.9–4.9)
Alkaline Phosphatase: 109 IU/L (ref 44–121)
BUN/Creatinine Ratio: 15 (ref 12–28)
BUN: 13 mg/dL (ref 8–27)
Bilirubin Total: 0.4 mg/dL (ref 0.0–1.2)
CO2: 23 mmol/L (ref 20–29)
Calcium: 9.7 mg/dL (ref 8.7–10.3)
Chloride: 100 mmol/L (ref 96–106)
Creatinine, Ser: 0.84 mg/dL (ref 0.57–1.00)
Globulin, Total: 3.3 g/dL (ref 1.5–4.5)
Glucose: 102 mg/dL — ABNORMAL HIGH (ref 70–99)
Potassium: 4.8 mmol/L (ref 3.5–5.2)
Sodium: 138 mmol/L (ref 134–144)
Total Protein: 7.6 g/dL (ref 6.0–8.5)
eGFR: 78 mL/min/{1.73_m2} (ref >59)

## 2022-03-09 LAB — TSH: TSH: 5.04 u[IU]/mL — ABNORMAL HIGH (ref 0.450–4.500)

## 2022-03-09 LAB — VITAMIN D 25 HYDROXY (VIT D DEFICIENCY, FRACTURES): Vit D, 25-Hydroxy: 41.2 ng/mL (ref 30.0–100.0)

## 2022-03-10 ENCOUNTER — Other Ambulatory Visit: Payer: Self-pay | Admitting: "Endocrinology

## 2022-03-10 DIAGNOSIS — E782 Mixed hyperlipidemia: Secondary | ICD-10-CM

## 2022-03-15 ENCOUNTER — Ambulatory Visit: Payer: 59 | Admitting: "Endocrinology

## 2022-03-15 ENCOUNTER — Encounter: Payer: Self-pay | Admitting: "Endocrinology

## 2022-03-15 VITALS — BP 112/80 | HR 64 | Ht 72.0 in | Wt 206.4 lb

## 2022-03-15 DIAGNOSIS — E890 Postprocedural hypothyroidism: Secondary | ICD-10-CM | POA: Diagnosis not present

## 2022-03-15 DIAGNOSIS — E782 Mixed hyperlipidemia: Secondary | ICD-10-CM | POA: Diagnosis not present

## 2022-03-15 DIAGNOSIS — R7303 Prediabetes: Secondary | ICD-10-CM | POA: Diagnosis not present

## 2022-03-15 LAB — POCT GLYCOSYLATED HEMOGLOBIN (HGB A1C): HbA1c, POC (prediabetic range): 6 % (ref 5.7–6.4)

## 2022-03-15 MED ORDER — ROSUVASTATIN CALCIUM 20 MG PO TABS: 20.0000 mg | ORAL_TABLET | Freq: Every day | ORAL | 1 refills | Status: DC

## 2022-03-15 MED ORDER — LEVOTHYROXINE SODIUM 150 MCG PO TABS: 150.0000 ug | ORAL_TABLET | Freq: Every day | ORAL | 1 refills | Status: DC

## 2022-03-15 NOTE — Patient Instructions (Signed)
                                     Advice for Weight Management  -For most of us the best way to lose weight is by diet management. Generally speaking, diet management means consuming less calories intentionally which over time brings about progressive weight loss.  This can be achieved more effectively by avoiding ultra processed carbohydrates, processed meats, unhealthy fats.    It is critically important to know your numbers: how much calorie you are consuming and how much calorie you need. More importantly, our carbohydrates sources should be unprocessed naturally occurring  complex starch food items.  It is always important to balance nutrition also by  appropriate intake of proteins (mainly plant-based), healthy fats/oils, plenty of fruits and vegetables.   -The American College of Lifestyle Medicine (ACL M) recommends nutrition derived mostly from Whole Food, Plant Predominant Sources example an apple instead of applesauce or apple pie. Eat Plenty of vegetables, Mushrooms, fruits, Legumes, Whole Grains, Nuts, seeds in lieu of processed meats, processed snacks/pastries red meat, poultry, eggs.  Use only water or unsweetened tea for hydration.  The College also recommends the need to stay away from risky substances including alcohol, smoking; obtaining 7-9 hours of restorative sleep, at least 150 minutes of moderate intensity exercise weekly, importance of healthy social connections, and being mindful of stress and seek help when it is overwhelming.    -Sticking to a routine mealtime to eat 3 meals a day and avoiding unnecessary snacks is shown to have a big role in weight control. Under normal circumstances, the only time we burn stored energy is when we are hungry, so allow  some hunger to take place- hunger means no food between appropriate meal times, only water.  It is not advisable to starve.   -It is better to avoid simple carbohydrates including:  Cakes, Sweet Desserts, Ice Cream, Soda (diet and regular), Sweet Tea, Candies, Chips, Cookies, Store Bought Juices, Alcohol in Excess of  1-2 drinks a day, Lemonade,  Artificial Sweeteners, Doughnuts, Coffee Creamers, "Sugar-free" Products, etc, etc.  This is not a complete list.....    -Consulting with certified diabetes educators is proven to provide you with the most accurate and current information on diet.  Also, you may be  interested in discussing diet options/exchanges , we can schedule a visit with Elizabeth Lopez, RDN, CDE for individualized nutrition education.  -Exercise: If you are able: 30 -60 minutes a day ,4 days a week, or 150 minutes of moderate intensity exercise weekly.    The longer the better if tolerated.  Combine stretch, strength, and aerobic activities.  If you were told in the past that you have high risk for cardiovascular diseases, or if you are currently symptomatic, you may seek evaluation by your heart doctor prior to initiating moderate to intense exercise programs.                                  Additional Care Considerations for Diabetes/Prediabetes   -Diabetes  is a chronic disease.  The most important care consideration is regular follow-up with your diabetes care provider with the goal being avoiding or delaying its complications and to take advantage of advances in medications and technology.  If appropriate actions are taken early enough, type 2 diabetes can even be   reversed.  Seek information from the right source.  - Whole Food, Plant Predominant Nutrition is highly recommended: Eat Plenty of vegetables, Mushrooms, fruits, Legumes, Whole Grains, Nuts, seeds in lieu of processed meats, processed snacks/pastries red meat, poultry, eggs as recommended by American College of  Lifestyle Medicine (ACLM).  -Type 2 diabetes is known to coexist with other important comorbidities such as high blood pressure and high cholesterol.  It is critical to control not only the  diabetes but also the high blood pressure and high cholesterol to minimize and delay the risk of complications including coronary artery disease, stroke, amputations, blindness, etc.  The good news is that this diet recommendation for type 2 diabetes is also very helpful for managing high cholesterol and high blood blood pressure.  - Studies showed that people with diabetes will benefit from a class of medications known as ACE inhibitors and statins.  Unless there are specific reasons not to be on these medications, the standard of care is to consider getting one from these groups of medications at an optimal doses.  These medications are generally considered safe and proven to help protect the heart and the kidneys.    - People with diabetes are encouraged to initiate and maintain regular follow-up with eye doctors, foot doctors, dentists , and if necessary heart and kidney doctors.     - It is highly recommended that people with diabetes quit smoking or stay away from smoking, and get yearly  flu vaccine and pneumonia vaccine at least every 5 years.  See above for additional recommendations on exercise, sleep, stress management , and healthy social connections.      

## 2022-03-15 NOTE — Progress Notes (Signed)
03/15/2022      Endocrinology follow-up note   Subjective:    Patient ID: Elizabeth Lopez, female    DOB: 1955-07-09, PCP Celene Squibb, MD   Past Medical History:  Diagnosis Date   Anxiety    Chronic constipation    Family hx of colon cancer    GERD (gastroesophageal reflux disease)    Hyperlipidemia    Hypothyroidism    Past Surgical History:  Procedure Laterality Date   COLONOSCOPY     COLONOSCOPY  02/12/2011   Procedure: COLONOSCOPY;  Surgeon: Rogene Houston, MD;  [facility];  Service: Endoscopy;  Laterality: N/A;  9:45   COLONOSCOPY N/A 02/12/2016   Procedure: COLONOSCOPY;  Surgeon: Rogene Houston, MD;  [facility];  Service: Endoscopy;  Laterality: N/A;  730   COLONOSCOPY WITH PROPOFOL N/A 01/14/2021   Procedure: COLONOSCOPY WITH PROPOFOL;  Surgeon: Rogene Houston, MD;  [facility];  Service: Endoscopy;  Laterality: N/A;  8:30   ESOPHAGOGASTRODUODENOSCOPY N/A 10/05/2013   Procedure: ESOPHAGOGASTRODUODENOSCOPY (EGD);  Surgeon: Rogene Houston, MD;  [facility];  Service: Endoscopy;  Laterality: N/A;  1200-rescheduled to [place] notified pt   knot     patient had a knot removed from right wrisit 9 years ago.   LASIK     WRIST GANGLION EXCISION     right   Social History   Socioeconomic History   Marital status: Married    Spouse name: Not on file   Number of children: Not on file   Years of education: Not on file   Highest education level: Not on file  Occupational History   Not on file  Tobacco Use   Smoking status: Never   Smokeless tobacco: Never  Vaping Use   Vaping Use: Never used  Substance and Sexual Activity   Alcohol use: Yes    Comment: occasional wine   Drug use: No   Sexual activity: Not on file  Other Topics Concern   Not on file  Social History Narrative   Not on file   Social Determinants of Health   Financial Resource Strain: Not on file  Food Insecurity: Not on file  Transportation  Needs: Not on file  Physical Activity: Not on file  Stress: Not on file  Social Connections: Not on file   Outpatient Encounter Medications as of 03/15/2022  Medication Sig   ALPRAZolam (XANAX) 0.5 MG tablet Take 0.5 mg by mouth 2 (two) times daily as needed for anxiety.   Cholecalciferol (VITAMIN D PO) Take 1 tablet by mouth daily.   COD LIVER OIL PO Take 1 capsule by mouth daily.   ELDERBERRY PO Take by mouth daily at 6 (six) AM.   levothyroxine (SYNTHROID) 150 MCG tablet Take 1 tablet (150 mcg total) by mouth daily before breakfast.   omeprazole (PRILOSEC) 20 MG capsule Take 1 capsule (20 mg total) by mouth daily. .   rosuvastatin (CRESTOR) 20 MG tablet Take 1 tablet (20 mg total) by mouth daily.   vitamin B-12 (CYANOCOBALAMIN) 500 MCG tablet Take 500 mcg by mouth daily.     [DISCONTINUED] levothyroxine (SYNTHROID) 137 MCG tablet TAKE 1 TABLET BY MOUTH DAILY BEFORE BREAKFAST.   [DISCONTINUED] rosuvastatin (CRESTOR) 10 MG tablet TAKE 1 TABLET BY MOUTH EVERY DAY   No facility-administered encounter medications on file as of 03/15/2022.   ALLERGIES: No Known Allergies VACCINATION STATUS: Immunization History  Administered Date(s) Administered   Influenza-Unspecified 12/01/2015  Moderna Sars-Covid-2 Vaccination 04/23/2019, 05/22/2019, 01/03/2020    HPI  67 yo female with with Graves disease status po I131 therapy on Aril 2012. She remains on Synthroid 137 mcg p.o. daily before breakfast.  She reports consistency and compliance to her medications.    Her previsit labs are consistent with slight under replacement.  However she continues to have severe dyslipidemia and prediabetes.    She is also on Crestor 10 mg p.o. nightly for hyperlipidemia.   -She has fluctuating body weight, presents with a gain of 6 pounds since last visit.   -She denies palpitations, tremors, heat intolerance.   Review of Systems  Limited as above.  Objective:    BP 112/80   Pulse 64   Ht 6' (1.829  m)   Wt 206 lb 6.4 oz (93.6 kg)   LMP 07/30/2016 (Approximate)   BMI 27.99 kg/m   Wt Readings from Last 3 Encounters:  03/15/22 206 lb 6.4 oz (93.6 kg)  02/01/22 199 lb 6.4 oz (90.4 kg)  09/10/21 195 lb 3.2 oz (88.5 kg)    Physical Exam   Results for orders placed or performed in visit on 03/15/22  HgB A1c  Result Value Ref Range   Hemoglobin A1C     HbA1c POC (<> result, manual entry)     HbA1c, POC (prediabetic range) 6.0 5.7 - 6.4 %   HbA1c, POC (controlled diabetic range)     Complete Blood Count (Most recent): Lab Results  Component Value Date   WBC 7.1 11/10/2018   HGB 11.8 (A) 06/16/2010   HCT 42 (A) 11/10/2018   MCV 90 11/10/2018   PLT 279 06/16/2010   Chemistry (most recent): Lab Results  Component Value Date   NA 138 03/08/2022   K 4.8 03/08/2022   CL 100 03/08/2022   CO2 23 03/08/2022   BUN 13 03/08/2022   CREATININE 0.84 03/08/2022     Assessment & Plan:   RAI induced hypothyroidism, hyperlipidemia, prediabetes  Hypothyroidism due to RAI   - Her visit thyroid function tests are consistent with slight under replacement.  I discussed and increase her Synthroid to 150 mcg p.o. daily before breakfast.     - We discussed about the correct intake of her thyroid hormone, on empty stomach at fasting, with water, separated by at least 30 minutes from breakfast and other medications,  and separated by more than 4 hours from calcium, iron, multivitamins, acid reflux medications (PPIs). -Patient is made aware of the fact that thyroid hormone replacement is needed for life, dose to be adjusted by periodic monitoring of thyroid function tests.    Hyperlipidemia: She presents with worsening dyslipidemia.  I advised her to increase her Crestor to 20 mg p.o. nightly, side effects and precautions discussed with her.   Considering her metabolic dysfunction involving prediabetes and severe dyslipidemia, she remains a good candidate for lifestyle medicine.  - she  acknowledges that there is a room for improvement in her food and drink choices. - Suggestion is made for her to avoid simple carbohydrates  from her diet including Cakes, Sweet Desserts, Ice Cream, Soda (diet and regular), Sweet Tea, Candies, Chips, Cookies, Store Bought Juices, Alcohol , Artificial Sweeteners,  Coffee Creamer, and "Sugar-free" Products, Lemonade. This will help patient to have more stable blood glucose profile and potentially avoid unintended weight gain.  The following Lifestyle Medicine recommendations according to [place]  [facility]) were discussed and and offered to patient and she  agrees  to start the journey:  A. Whole Foods, Plant-Based Nutrition comprising of fruits and vegetables, plant-based proteins, whole-grain carbohydrates was discussed in detail with the patient.   A list for source of those nutrients were also provided to the patient.  Patient will use only water or unsweetened tea for hydration. B.  The need to stay away from risky substances including alcohol, smoking; obtaining 7 to 9 hours of restorative sleep, at least 150 minutes of moderate intensity exercise weekly, the importance of healthy social connections,  and stress management techniques were discussed. C.  A full color page of  Calorie density of various food groups per pound showing examples of each food groups was provided to the patient.     - I advised patient to maintain close follow up with Celene Squibb, MD for primary care needs.                                                 She is advised to maintain close follow-up with her PCP.                       I spent 26 minutes in the care of the patient today including review of labs from Thyroid Function, CMP, and other relevant labs ; imaging/biopsy records (current and previous including abstractions from other facilities); face-to-face time discussing  her lab results and symptoms, medications doses, her options of  short and long term treatment based on the latest standards of care / guidelines;   and documenting the encounter.  Elizabeth Lopez  participated in the discussions, expressed understanding, and voiced agreement with the above plans.  All questions were answered to her satisfaction. she is encouraged to contact clinic should she have any questions or concerns prior to her return visit.   Follow up plan: Return in about 6 months (around 09/13/2022) for Fasting Labs  in AM B4 8, A1c -[place].  Glade Lloyd, MD Phone: [phone]  Fax: [phone]  -  This note was partially dictated with voice recognition software. Similar sounding words can be transcribed inadequately or may not  be corrected upon review.  03/15/2022, 5:59 PM

## 2022-05-11 ENCOUNTER — Other Ambulatory Visit: Payer: Self-pay

## 2022-05-11 ENCOUNTER — Ambulatory Visit (HOSPITAL_COMMUNITY): Payer: 59 | Attending: Internal Medicine

## 2022-05-11 DIAGNOSIS — M5416 Radiculopathy, lumbar region: Secondary | ICD-10-CM | POA: Insufficient documentation

## 2022-05-11 NOTE — Therapy (Signed)
OUTPATIENT PHYSICAL THERAPY THORACOLUMBAR EVALUATION   Patient Name: Elizabeth Lopez MRN: [id number] DOB:03-25-1955, 67 y.o., female Today's Date: 05/11/2022  END OF SESSION:  PT End of Session - 05/11/22 1358     Visit Number 1    Number of Visits 8    Date for PT Re-Evaluation 06/08/22    Authorization Type [employer] (no auth, no visit limits)    PT Start Time 1355    PT Stop Time 1435    PT Time Calculation (min) 40 min    Activity Tolerance Patient tolerated treatment well    Behavior During Therapy WFL for tasks assessed/performed            Past Medical History:  Diagnosis Date   Anxiety    Chronic constipation    Family hx of colon cancer    GERD (gastroesophageal reflux disease)    Hyperlipidemia    Hypothyroidism    Past Surgical History:  Procedure Laterality Date   COLONOSCOPY     COLONOSCOPY  02/12/2011   Procedure: COLONOSCOPY;  Surgeon: Rogene Houston, MD;  [facility];  Service: Endoscopy;  Laterality: N/A;  9:45   COLONOSCOPY N/A 02/12/2016   Procedure: COLONOSCOPY;  Surgeon: Rogene Houston, MD;  [facility];  Service: Endoscopy;  Laterality: N/A;  730   COLONOSCOPY WITH PROPOFOL N/A 01/14/2021   Procedure: COLONOSCOPY WITH PROPOFOL;  Surgeon: Rogene Houston, MD;  [facility];  Service: Endoscopy;  Laterality: N/A;  8:30   ESOPHAGOGASTRODUODENOSCOPY N/A 10/05/2013   Procedure: ESOPHAGOGASTRODUODENOSCOPY (EGD);  Surgeon: Rogene Houston, MD;  [facility];  Service: Endoscopy;  Laterality: N/A;  1200-rescheduled to [place] notified pt   knot     patient had a knot removed from right wrisit 9 years ago.   LASIK     WRIST GANGLION EXCISION     right   Patient Active Problem List   Diagnosis Date Noted   Prediabetes 09/10/2021   GERD (gastroesophageal reflux disease) 09/11/2019   Mixed hyperlipidemia 02/27/2019   Overweight (BMI 25.0-29.9) 02/24/2017   Family hx of colon cancer 10/09/2015    Hypothyroidism following radioiodine therapy 09/20/2013   High cholesterol 09/20/2013   Change in bowel habits 01/18/2011   Family history of colon cancer 01/18/2011    PCP: Celene Squibb, MD  REFERRING PROVIDER: Celene Squibb, MD  REFERRING DIAG: M54.41 (ICD-10-CM) - Lumbago with sciatica, right side  Rationale for Evaluation and Treatment: Rehabilitation  THERAPY DIAG:  Radiculopathy, lumbar region  ONSET DATE: December 2023  SUBJECTIVE:  SUBJECTIVE STATEMENT: Denies back pain but complains of constant tingling on the R foot, particularly on the toes (big toe is worst). Tingling sensation is rated at 4/10. Denies weakness on the legs. Tingling is worse after sitting for prolonged periods around 3 hours. Denies any relieving factors. Condition started first on the R hip in December 2023 without apparent reason and gradually got worse and progressed to the R foot. Patient then went to her MD. Denies any imaging for the back or legs. Patient was given muscle relaxers which did not help. Patient is also referred to outpatient PT evaluation and management.  PERTINENT HISTORY:  anxiety  PAIN:  Are you having pain? No  PRECAUTIONS: None  WEIGHT BEARING RESTRICTIONS: No  FALLS:  Has patient fallen in last 6 months? No  LIVING ENVIRONMENT: Lives with: lives with their spouse Lives in: House/apartment Stairs: Yes: External: 2 steps; on left going up Has following equipment at home: Single point cane and Walker - 2 wheeled  OCCUPATION: Patient is a Oceanographer. Patient states that back doesn't affect her work  PLOF: Independent  PATIENT GOALS: "I don't know because I don't know if PT can stop the tingling at your feet"  NEXT MD VISIT: May 2023  OBJECTIVE:   DIAGNOSTIC FINDINGS:  None  to date  PATIENT SURVEYS:  FOTO 94.05  SCREENING FOR RED FLAGS: Bowel or bladder incontinence: No Spinal tumors: No Cauda equina syndrome: No  COGNITION: Overall cognitive status: Within functional limits for tasks assessed     SENSATION: Light touch: WFL on B LE  MUSCLE LENGTH: Moderate restriction on B hamstrings Mild restriction on B hip flexors and piriformis  POSTURE: rounded shoulders, forward head, and increased lumbar lordosis   LUMBAR ROM:   AROM eval  Flexion 100%  Extension 100%  Right lateral flexion   Left lateral flexion   Right rotation 100%  Left rotation 100%   (Blank rows = not tested)  LOWER EXTREMITY ROM:     Active  Right eval Left eval  Hip flexion [facility]  Hip extension [facility]  Hip abduction [facility]  Hip adduction    Hip internal rotation    Hip external rotation    Knee flexion [facility] WFL  Knee extension [facility] WFL  Ankle dorsiflexion [facility] WFL  Ankle plantarflexion [facility] WFL  Ankle inversion    Ankle eversion     (Blank rows = not tested)  LOWER EXTREMITY MMT:    MMT Right eval Left eval  Hip flexion 4 4+  Hip extension 3+ 3+  Hip abduction 4+ 4+  Hip adduction    Hip internal rotation    Hip external rotation    Knee flexion 4+ 5  Knee extension 4+ 5  Ankle dorsiflexion 4+ 5  Ankle plantarflexion 5 5  Ankle inversion    Ankle eversion    L5 myotome (big toe ext): R = 3+/5,  L = 5/5 (Blank rows = not tested)  LUMBAR SPECIAL TESTS:  Straight leg raise test: Positive and Slump test: Negative Repeated flex/ext (particularly ext) relieves symptoms on the R LE Prone on elbows relieves symptoms on LE  FUNCTIONAL TESTS:  5 times sit to stand: 7.93 sec 2 minute walk test: 661 ft (tingling on the L foot = 2/10  GAIT: Distance walked: 661 ft Assistive device utilized: None Level of assistance: Complete Independence Comments: no gait deviations noted  TODAY'S TREATMENT:  DATE:  05/11/2022 Evaluation and patient education Supine R sciatic nerve glide x 1' POE x 3" x 10 x 2    PATIENT EDUCATION:  Education details: Educated on the pathoanatomy of sciatica Educated on the goals and course of rehab. Written HEP provided and reviewed Person educated: Patient Education method: Explanation, Demonstration, and Handouts Education comprehension: verbalized understanding and returned demonstration  HOME EXERCISE PROGRAM: Access Code: [email or web address] URL: [email or web address] Date: 05/11/2022 Prepared by: Rexene Alberts  Exercises - Supine Sciatic Nerve Glide  - 2 x daily - 7 x weekly - Prone Press Up On Elbows  - 2 x daily - 7 x weekly - 2 sets - 10 reps - 3 hold  ASSESSMENT:  CLINICAL IMPRESSION: Patient is a 67 y.o. female who was seen today for physical therapy evaluation and treatment for lumbago with R sciatica. Patient was diagnosed with lumbago with R sciaticaby referring provider further defined by difficulty with prolonged sitting due to pain, weakness, and decreased soft tissue extensibility. Skilled PT is required to address the impairments and functional limitations listed below.   OBJECTIVE IMPAIRMENTS: decreased strength, impaired flexibility, and impaired sensation.   ACTIVITY LIMITATIONS: sitting  PARTICIPATION LIMITATIONS: driving and community activity  PERSONAL FACTORS: Time since onset of injury/illness/exacerbation are also affecting patient's functional outcome.   REHAB POTENTIAL: Excellent  CLINICAL DECISION MAKING: Stable/uncomplicated  EVALUATION COMPLEXITY: Low   GOALS: Goals reviewed with patient? Yes  SHORT TERM GOALS: Target date: 05/25/2022  Pt will demonstrate indep in HEP to facilitate carry-over of skilled services and improve functional outcomes Goal status: INITIAL  LONG TERM GOALS: Target date: 06/08/2022  Pt will demonstrate  increase in LE strength to 5/5 to facilitate ease and safety in ambulation Baseline: 3+/5 Goal status: INITIAL  2.  Pt will report absence of tingling sensation on the L foot to 0/10 to facilitate ease in prolonged periods of sitting Baseline: 4/10 Goal status: INITIAL PLAN:  PT FREQUENCY: 2x/week  PT DURATION: 4 weeks  PLANNED INTERVENTIONS: Therapeutic exercises, Therapeutic activity, Neuromuscular re-education, Patient/Family education, Self Care, and Manual therapy.  PLAN FOR NEXT SESSION: May begin LE flexibility, hip and core strengthening. Provide ext bias exercises. Progress exercises as tolerated.   Harvie Heck. D'Arcy Abraha, PT, DPT, OCS Board-Certified Clinical Specialist in [place] # ([place]): [id number] T 05/11/2022, 5:05 PM

## 2022-05-12 ENCOUNTER — Ambulatory Visit (HOSPITAL_COMMUNITY): Payer: 59 | Admitting: Physical Therapy

## 2022-05-20 ENCOUNTER — Encounter (HOSPITAL_COMMUNITY): Payer: 59 | Admitting: Physical Therapy

## 2022-06-01 ENCOUNTER — Ambulatory Visit (HOSPITAL_COMMUNITY): Payer: 59 | Attending: Internal Medicine | Admitting: Physical Therapy

## 2022-06-01 DIAGNOSIS — M5416 Radiculopathy, lumbar region: Secondary | ICD-10-CM | POA: Diagnosis not present

## 2022-06-01 NOTE — Therapy (Signed)
OUTPATIENT PHYSICAL THERAPY THORACOLUMBAR EVALUATION   Patient Name: Elizabeth Lopez MRN: [id number] DOB:10-19-1955, 67 y.o., female Today's Date: 06/01/2022   END OF SESSION:  PT End of Session - 06/01/22 1121     Visit Number 2    Number of Visits 8    Date for PT Re-Evaluation 06/08/22    Authorization Type [employer] (no auth, no visit limits)    PT Start Time 1116    PT Stop Time 1154    PT Time Calculation (min) 38 min    Activity Tolerance Patient tolerated treatment well    Behavior During Therapy WFL for tasks assessed/performed            Past Medical History:  Diagnosis Date   Anxiety    Chronic constipation    Family hx of colon cancer    GERD (gastroesophageal reflux disease)    Hyperlipidemia    Hypothyroidism    Past Surgical History:  Procedure Laterality Date   COLONOSCOPY     COLONOSCOPY  02/12/2011   Procedure: COLONOSCOPY;  Surgeon: Rogene Houston, MD;  [facility];  Service: Endoscopy;  Laterality: N/A;  9:45   COLONOSCOPY N/A 02/12/2016   Procedure: COLONOSCOPY;  Surgeon: Rogene Houston, MD;  [facility];  Service: Endoscopy;  Laterality: N/A;  730   COLONOSCOPY WITH PROPOFOL N/A 01/14/2021   Procedure: COLONOSCOPY WITH PROPOFOL;  Surgeon: Rogene Houston, MD;  [facility];  Service: Endoscopy;  Laterality: N/A;  8:30   ESOPHAGOGASTRODUODENOSCOPY N/A 10/05/2013   Procedure: ESOPHAGOGASTRODUODENOSCOPY (EGD);  Surgeon: Rogene Houston, MD;  [facility];  Service: Endoscopy;  Laterality: N/A;  1200-rescheduled to [place] notified pt   knot     patient had a knot removed from right wrisit 9 years ago.   LASIK     WRIST GANGLION EXCISION     right   Patient Active Problem List   Diagnosis Date Noted   Prediabetes 09/10/2021   GERD (gastroesophageal reflux disease) 09/11/2019   Mixed hyperlipidemia 02/27/2019   Overweight (BMI 25.0-29.9) 02/24/2017   Family hx of colon cancer 10/09/2015    Hypothyroidism following radioiodine therapy 09/20/2013   High cholesterol 09/20/2013   Change in bowel habits 01/18/2011   Family history of colon cancer 01/18/2011    PCP: Celene Squibb, MD  REFERRING PROVIDER: Celene Squibb, MD  REFERRING DIAG: M54.41 (ICD-10-CM) - Lumbago with sciatica, right side  Rationale for Evaluation and Treatment: Rehabilitation  THERAPY DIAG:  Radiculopathy, lumbar region  ONSET DATE: December 2023  SUBJECTIVE:  SUBJECTIVE STATEMENT: Patient says she did fine with HEP exercises. Says she still have numbness/ tingling in her toes.   Eval: Denies back pain but complains of constant tingling on the R foot, particularly on the toes (big toe is worst). Tingling sensation is rated at 4/10. Denies weakness on the legs. Tingling is worse after sitting for prolonged periods around 3 hours. Denies any relieving factors. Condition started first on the R hip in December 2023 without apparent reason and gradually got worse and progressed to the R foot. Patient then went to her MD. Denies any imaging for the back or legs. Patient was given muscle relaxers which did not help. Patient is also referred to outpatient PT evaluation and management.  PERTINENT HISTORY:  anxiety  PAIN:  Are you having pain? No (no pain just tingling in RT foot)   PRECAUTIONS: None  WEIGHT BEARING RESTRICTIONS: No  FALLS:  Has patient fallen in last 6 months? No  LIVING ENVIRONMENT: Lives with: lives with their spouse Lives in: House/apartment Stairs: Yes: External: 2 steps; on left going up Has following equipment at home: Single point cane and Walker - 2 wheeled  OCCUPATION: Patient is a Oceanographer. Patient states that back doesn't affect her work  PLOF: Independent  PATIENT GOALS: "I  don't know because I don't know if PT can stop the tingling at your feet"  NEXT MD VISIT: May 2023  OBJECTIVE:   DIAGNOSTIC FINDINGS:  None to date  PATIENT SURVEYS:  FOTO 94.05  SCREENING FOR RED FLAGS: Bowel or bladder incontinence: No Spinal tumors: No Cauda equina syndrome: No  COGNITION: Overall cognitive status: Within functional limits for tasks assessed     SENSATION: Light touch: WFL on B LE  MUSCLE LENGTH: Moderate restriction on B hamstrings Mild restriction on B hip flexors and piriformis  POSTURE: rounded shoulders, forward head, and increased lumbar lordosis   LUMBAR ROM:   AROM eval  Flexion 100%  Extension 100%  Right lateral flexion   Left lateral flexion   Right rotation 100%  Left rotation 100%   (Blank rows = not tested)  LOWER EXTREMITY ROM:     Active  Right eval Left eval  Hip flexion [facility]  Hip extension [facility]  Hip abduction [facility]  Hip adduction    Hip internal rotation    Hip external rotation    Knee flexion [facility] WFL  Knee extension [facility] WFL  Ankle dorsiflexion [facility] WFL  Ankle plantarflexion [facility] WFL  Ankle inversion    Ankle eversion     (Blank rows = not tested)  LOWER EXTREMITY MMT:    MMT Right eval Left eval  Hip flexion 4 4+  Hip extension 3+ 3+  Hip abduction 4+ 4+  Hip adduction    Hip internal rotation    Hip external rotation    Knee flexion 4+ 5  Knee extension 4+ 5  Ankle dorsiflexion 4+ 5  Ankle plantarflexion 5 5  Ankle inversion    Ankle eversion    L5 myotome (big toe ext): R = 3+/5,  L = 5/5 (Blank rows = not tested)  LUMBAR SPECIAL TESTS:  Straight leg raise test: Positive and Slump test: Negative Repeated flex/ext (particularly ext) relieves symptoms on the R LE Prone on elbows relieves symptoms on LE  FUNCTIONAL TESTS:  5 times sit to stand: 7.93 sec 2 minute walk test: 661 ft (tingling on the L foot = 2/10  GAIT: Distance walked: 661 ft Assistive device  utilized: None Level of  assistance: Complete Independence Comments: no gait deviations noted  TODAY'S TREATMENT:                                                                                                                              DATE:  06/01/22 POE 2 min (abolished tingling in foot)   Ab brace 10 x 5"  Ab march 2 x 10 SLR with ab march 2 x 10 Bridge 2 x 10 Sciatic nerve glide x20  05/11/2022 Evaluation and patient education Supine R sciatic nerve glide x 1' POE x 3" x 10 x 2    PATIENT EDUCATION:  Education details: Educated on the pathoanatomy of sciatica Educated on the goals and course of rehab. Written HEP provided and reviewed Person educated: Patient Education method: Explanation, Demonstration, and Handouts Education comprehension: verbalized understanding and returned demonstration  HOME EXERCISE PROGRAM: Access Code: [email or web address] URL: [email or web address] 06/01/22 - Supine Transversus Abdominis Bracing - Hands on Stomach  - 2 x daily - 7 x weekly - 2 sets - 10 reps - 5 second hold - Supine March  - 2 x daily - 7 x weekly - 2 sets - 10 reps - Small Range Straight Leg Raise  - 2 x daily - 7 x weekly - 2 sets - 10 reps - Supine Bridge  - 2 x daily - 7 x weekly - 2 sets - 10 reps  Date: 05/11/2022 Prepared by: Rexene Alberts  Exercises - Supine Sciatic Nerve Glide  - 2 x daily - 7 x weekly - Prone Press Up On Elbows  - 2 x daily - 7 x weekly - 2 sets - 10 reps - 3 hold  ASSESSMENT:  CLINICAL IMPRESSION: Patient tolerated session well today. Initiated there ex. Progressed core and hip strengthening activity to stabilize lumbar spine and reduce radicular sx. Patient noting resolution of sx in RT foot with prone on elbows. No return of sx throughout session. Patient educated on purpose and function of all added exercise and required verbal cues and demo for TA bracing. Issued updated HEP handout. Patient will continue to benefit from skilled therapy services to reduce remaining  deficits and improve functional ability.    OBJECTIVE IMPAIRMENTS: decreased strength, impaired flexibility, and impaired sensation.   ACTIVITY LIMITATIONS: sitting  PARTICIPATION LIMITATIONS: driving and community activity  PERSONAL FACTORS: Time since onset of injury/illness/exacerbation are also affecting patient's functional outcome.   REHAB POTENTIAL: Excellent  CLINICAL DECISION MAKING: Stable/uncomplicated  EVALUATION COMPLEXITY: Low   GOALS: Goals reviewed with patient? Yes  SHORT TERM GOALS: Target date: 05/25/2022  Pt will demonstrate indep in HEP to facilitate carry-over of skilled services and improve functional outcomes Goal status: INITIAL  LONG TERM GOALS: Target date: 06/08/2022  Pt will demonstrate increase in LE strength to 5/5 to facilitate ease and safety in ambulation Baseline: 3+/5 Goal status: INITIAL  2.  Pt will report absence of tingling sensation on the L foot  to 0/10 to facilitate ease in prolonged periods of sitting Baseline: 4/10 Goal status: INITIAL PLAN:  PT FREQUENCY: 2x/week  PT DURATION: 4 weeks  PLANNED INTERVENTIONS: Therapeutic exercises, Therapeutic activity, Neuromuscular re-education, Patient/Family education, Self Care, and Manual therapy.  PLAN FOR NEXT SESSION: Continue LE flexibility, hip and core strengthening. Provide ext bias exercises. Progress exercises as tolerated.  11:53 AM, 06/01/22 Josue Hector PT DPT  Physical Therapist with [facility]  [phone]

## 2022-06-04 ENCOUNTER — Encounter (HOSPITAL_COMMUNITY): Payer: 59

## 2022-07-01 DIAGNOSIS — Z0000 Encounter for general adult medical examination without abnormal findings: Secondary | ICD-10-CM | POA: Diagnosis not present

## 2022-07-01 DIAGNOSIS — R7303 Prediabetes: Secondary | ICD-10-CM | POA: Diagnosis not present

## 2022-07-01 DIAGNOSIS — K219 Gastro-esophageal reflux disease without esophagitis: Secondary | ICD-10-CM | POA: Diagnosis not present

## 2022-07-01 DIAGNOSIS — E782 Mixed hyperlipidemia: Secondary | ICD-10-CM | POA: Diagnosis not present

## 2022-07-01 DIAGNOSIS — F411 Generalized anxiety disorder: Secondary | ICD-10-CM | POA: Diagnosis not present

## 2022-07-01 DIAGNOSIS — Z800 Family history of malignant neoplasm of digestive organs: Secondary | ICD-10-CM | POA: Diagnosis not present

## 2022-07-01 DIAGNOSIS — M810 Age-related osteoporosis without current pathological fracture: Secondary | ICD-10-CM | POA: Diagnosis not present

## 2022-07-01 DIAGNOSIS — E039 Hypothyroidism, unspecified: Secondary | ICD-10-CM | POA: Diagnosis not present

## 2022-07-01 DIAGNOSIS — M5441 Lumbago with sciatica, right side: Secondary | ICD-10-CM | POA: Diagnosis not present

## 2022-09-07 DIAGNOSIS — H524 Presbyopia: Secondary | ICD-10-CM | POA: Diagnosis not present

## 2022-09-08 DIAGNOSIS — E039 Hypothyroidism, unspecified: Secondary | ICD-10-CM | POA: Diagnosis not present

## 2022-09-08 DIAGNOSIS — Z6827 Body mass index (BMI) 27.0-27.9, adult: Secondary | ICD-10-CM | POA: Diagnosis not present

## 2022-09-13 ENCOUNTER — Ambulatory Visit: Payer: 59 | Admitting: "Endocrinology

## 2022-09-25 ENCOUNTER — Other Ambulatory Visit: Payer: Self-pay | Admitting: "Endocrinology

## 2022-09-25 DIAGNOSIS — E782 Mixed hyperlipidemia: Secondary | ICD-10-CM

## 2022-09-30 DIAGNOSIS — E039 Hypothyroidism, unspecified: Secondary | ICD-10-CM | POA: Diagnosis not present

## 2022-09-30 DIAGNOSIS — Z6827 Body mass index (BMI) 27.0-27.9, adult: Secondary | ICD-10-CM | POA: Diagnosis not present

## 2022-10-05 ENCOUNTER — Other Ambulatory Visit: Payer: Self-pay

## 2022-10-05 DIAGNOSIS — E890 Postprocedural hypothyroidism: Secondary | ICD-10-CM

## 2022-10-05 DIAGNOSIS — E782 Mixed hyperlipidemia: Secondary | ICD-10-CM

## 2022-10-05 DIAGNOSIS — R7303 Prediabetes: Secondary | ICD-10-CM

## 2022-10-06 DIAGNOSIS — E890 Postprocedural hypothyroidism: Secondary | ICD-10-CM | POA: Diagnosis not present

## 2022-10-06 DIAGNOSIS — E782 Mixed hyperlipidemia: Secondary | ICD-10-CM | POA: Diagnosis not present

## 2022-10-07 DIAGNOSIS — M25461 Effusion, right knee: Secondary | ICD-10-CM | POA: Diagnosis not present

## 2022-10-07 DIAGNOSIS — M25561 Pain in right knee: Secondary | ICD-10-CM | POA: Diagnosis not present

## 2022-10-12 ENCOUNTER — Ambulatory Visit: Payer: Medicare HMO | Admitting: "Endocrinology

## 2022-10-12 ENCOUNTER — Encounter: Payer: Self-pay | Admitting: "Endocrinology

## 2022-10-12 VITALS — BP 124/72 | HR 60 | Ht 72.0 in | Wt 199.4 lb

## 2022-10-12 DIAGNOSIS — E782 Mixed hyperlipidemia: Secondary | ICD-10-CM

## 2022-10-12 DIAGNOSIS — E890 Postprocedural hypothyroidism: Secondary | ICD-10-CM

## 2022-10-12 DIAGNOSIS — R7303 Prediabetes: Secondary | ICD-10-CM

## 2022-10-12 LAB — POCT GLYCOSYLATED HEMOGLOBIN (HGB A1C): HbA1c, POC (controlled diabetic range): 6 % (ref 0.0–7.0)

## 2022-10-12 NOTE — Progress Notes (Signed)
10/12/2022      Endocrinology follow-up note   Subjective:    Patient ID: Elizabeth Lopez, female    DOB: Aug 14, 1955, PCP Benita Stabile, MD   Past Medical History:  Diagnosis Date   Anxiety    Chronic constipation    Family hx of colon cancer    GERD (gastroesophageal reflux disease)    Hyperlipidemia    Hypothyroidism    Past Surgical History:  Procedure Laterality Date   COLONOSCOPY     COLONOSCOPY  02/12/2011   Procedure: COLONOSCOPY;  Surgeon: Malissa Hippo, MD;  [facility];  Service: Endoscopy;  Laterality: N/A;  9:45   COLONOSCOPY N/A 02/12/2016   Procedure: COLONOSCOPY;  Surgeon: Malissa Hippo, MD;  [facility];  Service: Endoscopy;  Laterality: N/A;  730   COLONOSCOPY WITH PROPOFOL N/A 01/14/2021   Procedure: COLONOSCOPY WITH PROPOFOL;  Surgeon: Malissa Hippo, MD;  [facility];  Service: Endoscopy;  Laterality: N/A;  8:30   ESOPHAGOGASTRODUODENOSCOPY N/A 10/05/2013   Procedure: ESOPHAGOGASTRODUODENOSCOPY (EGD);  Surgeon: Malissa Hippo, MD;  [facility];  Service: Endoscopy;  Laterality: N/A;  1200-rescheduled to [address] notified pt   knot     patient had a knot removed from right wrisit 9 years ago.   LASIK     WRIST GANGLION EXCISION     right   Social History   Socioeconomic History   Marital status: Married    Spouse name: Not on file   Number of children: Not on file   Years of education: Not on file   Highest education level: Not on file  Occupational History   Not on file  Tobacco Use   Smoking status: Never   Smokeless tobacco: Never  Vaping Use   Vaping status: Never Used  Substance and Sexual Activity   Alcohol use: Yes    Comment: occasional wine   Drug use: No   Sexual activity: Not on file  Other Topics Concern   Not on file  Social History Narrative   Not on file   Social Determinants of Health   Financial Resource Strain: Not on file  Food Insecurity: Not on file   Transportation Needs: Not on file  Physical Activity: Sufficiently Active (03/23/2021)   [facility], [facility]   Exercise Vital Sign    Days of Exercise per Week: 3 days    Minutes of Exercise per Session: 60 min  Stress: No Stress Concern Present (03/23/2021)   Received from [facility], [facility] - Occupational Stress Questionnaire    Feeling of Stress : Not at all  Social Connections: Unknown (07/10/2021)   Received from [facility]   Social Network    Social Network: Not on file   Outpatient Encounter Medications as of 10/12/2022  Medication Sig   ALPRAZolam (XANAX) 0.5 MG tablet Take 0.5 mg by mouth 2 (two) times daily as needed for anxiety.   Cholecalciferol (VITAMIN D PO) Take 1 tablet by mouth daily.   COD LIVER OIL PO Take 1 capsule by mouth daily.   ELDERBERRY PO Take by mouth daily at 6 (six) AM.   levothyroxine (SYNTHROID) 150 MCG tablet TAKE 1 TABLET BY MOUTH DAILY BEFORE BREAKFAST.   omeprazole (PRILOSEC) 20 MG capsule Take 1 capsule (20 mg total) by mouth daily. .   rosuvastatin (CRESTOR) 20 MG tablet TAKE 1 TABLET BY  MOUTH EVERY DAY   vitamin B-12 (CYANOCOBALAMIN) 500 MCG tablet Take 500 mcg by mouth daily.     No facility-administered encounter medications on file as of 10/12/2022.   ALLERGIES: No Known Allergies VACCINATION STATUS: Immunization History  Administered Date(s) Administered   Influenza-Unspecified 12/01/2015   Moderna Sars-Covid-2 Vaccination 04/23/2019, 05/22/2019, 01/03/2020    HPI  67 yo female with with Graves disease status po I131 therapy on Aril 2012. She remains on Synthroid 150 mcg p.o. daily before breakfast.  She reports consistency and compliance to her medications.   She states she did not lose weight, however her weight scale today is 7 pounds lower than her last visit.   her previsit labs are consistent with appropriate replacement.  Her previsit labs  show improved lipid panel, remains on Crestor 10 mg p.o. nightly.     She is also on Crestor 20 mg p.o. nightly for hyperlipidemia.   -She has fluctuating body weight, presents with 7 pounds of weight loss since last visit.     -She denies palpitations, tremors, heat intolerance.   Review of Systems  Limited as above.  Objective:    BP 124/72   Pulse 60   Ht 6' (1.829 m)   Wt 199 lb 6.4 oz (90.4 kg)   LMP 07/30/2016 (Approximate)   BMI 27.04 kg/m   Wt Readings from Last 3 Encounters:  10/12/22 199 lb 6.4 oz (90.4 kg)  03/15/22 206 lb 6.4 oz (93.6 kg)  02/01/22 199 lb 6.4 oz (90.4 kg)    Physical Exam   Results for orders placed or performed in visit on 10/12/22  HgB A1c  Result Value Ref Range   Hemoglobin A1C     HbA1c POC (<> result, manual entry)     HbA1c, POC (prediabetic range)     HbA1c, POC (controlled diabetic range) 6.0 0.0 - 7.0 %   Complete Blood Count (Most recent): Lab Results  Component Value Date   WBC 7.1 11/10/2018   HGB 11.8 (A) 06/16/2010   HCT 42 (A) 11/10/2018   MCV 90 11/10/2018   PLT 279 06/16/2010   Chemistry (most recent): Lab Results  Component Value Date   NA 138 03/08/2022   K 4.8 03/08/2022   CL 100 03/08/2022   CO2 23 03/08/2022   BUN 13 03/08/2022   CREATININE 0.84 03/08/2022     Assessment & Plan:   RAI induced hypothyroidism, hyperlipidemia, prediabetes  Hypothyroidism due to RAI   - Her visit thyroid function tests are consistent with appropriate replacement.  She is advised to continue  Synthroid  150 mcg p.o. daily before breakfast.    - We discussed about the correct intake of her thyroid hormone, on empty stomach at fasting, with water, separated by at least 30 minutes from breakfast and other medications,  and separated by more than 4 hours from calcium, iron, multivitamins, acid reflux medications (PPIs). -Patient is made aware of the fact that thyroid hormone replacement is needed for life, dose to be adjusted by  periodic monitoring of thyroid function tests.   Hyperlipidemia: She presents with improving lipid panel.  She is advised to continue Crestor 20 mg p.o. nightly.  Side effects and precautions discussed with her.     Considering her metabolic dysfunction involving prediabetes and severe dyslipidemia, she remains a good candidate for lifestyle medicine.    - she acknowledges that there is a room for improvement in her food and drink choices. - Suggestion is made for her to  avoid simple carbohydrates  from her diet including Cakes, Sweet Desserts, Ice Cream, Soda (diet and regular), Sweet Tea, Candies, Chips, Cookies, Store Bought Juices, Alcohol , Artificial Sweeteners,  Coffee Creamer, and "Sugar-free" Products, Lemonade. This will help patient to have more stable blood glucose profile and potentially avoid unintended weight gain.  The following Lifestyle Medicine recommendations according to American College of Lifestyle Medicine  [facility]) were discussed and and offered to patient and she  agrees to start the journey:  A. Whole Foods, Plant-Based Nutrition comprising of fruits and vegetables, plant-based proteins, whole-grain carbohydrates was discussed in detail with the patient.   A list for source of those nutrients were also provided to the patient.  Patient will use only water or unsweetened tea for hydration. B.  The need to stay away from risky substances including alcohol, smoking; obtaining 7 to 9 hours of restorative sleep, at least 150 minutes of moderate intensity exercise weekly, the importance of healthy social connections,  and stress management techniques were discussed. C.  A full color page of  Calorie density of various food groups per pound showing examples of each food groups was provided to the patient.  Her point-of-care A1c today remains at 6%.    - I advised patient to maintain close follow up with Benita Stabile, MD for primary care needs.                                                  She is advised to maintain close follow-up with her PCP.                        I spent  26  minutes in the care of the patient today including review of labs from Thyroid Function, CMP, and other relevant labs ; imaging/biopsy records (current and previous including abstractions from other facilities); face-to-face time discussing  her lab results and symptoms, medications doses, her options of short and long term treatment based on the latest standards of care / guidelines;   and documenting the encounter.  Elizabeth Lopez  participated in the discussions, expressed understanding, and voiced agreement with the above plans.  All questions were answered to her satisfaction. she is encouraged to contact clinic should she have any questions or concerns prior to her return visit.   Follow up plan: Return in about 6 months (around 04/14/2023) for Fasting Labs  in AM B4 8, A1c -[place].  Marquis Lunch, MD Phone: [phone]  Fax: [phone]  -  This note was partially dictated with voice recognition software. Similar sounding words can be transcribed inadequately or may not  be corrected upon review.  10/12/2022, 9:43 AM

## 2022-10-15 DIAGNOSIS — E039 Hypothyroidism, unspecified: Secondary | ICD-10-CM | POA: Diagnosis not present

## 2022-10-15 DIAGNOSIS — Z6826 Body mass index (BMI) 26.0-26.9, adult: Secondary | ICD-10-CM | POA: Diagnosis not present

## 2022-10-15 DIAGNOSIS — E559 Vitamin D deficiency, unspecified: Secondary | ICD-10-CM | POA: Diagnosis not present

## 2022-10-26 DIAGNOSIS — M25551 Pain in right hip: Secondary | ICD-10-CM | POA: Diagnosis not present

## 2022-10-26 DIAGNOSIS — M545 Low back pain, unspecified: Secondary | ICD-10-CM | POA: Diagnosis not present

## 2022-10-26 DIAGNOSIS — M5441 Lumbago with sciatica, right side: Secondary | ICD-10-CM | POA: Diagnosis not present

## 2022-10-26 DIAGNOSIS — M791 Myalgia, unspecified site: Secondary | ICD-10-CM | POA: Diagnosis not present

## 2022-11-09 DIAGNOSIS — E039 Hypothyroidism, unspecified: Secondary | ICD-10-CM | POA: Diagnosis not present

## 2022-11-09 DIAGNOSIS — Z6826 Body mass index (BMI) 26.0-26.9, adult: Secondary | ICD-10-CM | POA: Diagnosis not present

## 2022-11-30 DIAGNOSIS — E782 Mixed hyperlipidemia: Secondary | ICD-10-CM | POA: Diagnosis not present

## 2022-11-30 DIAGNOSIS — Z6826 Body mass index (BMI) 26.0-26.9, adult: Secondary | ICD-10-CM | POA: Diagnosis not present

## 2022-12-13 ENCOUNTER — Other Ambulatory Visit (HOSPITAL_COMMUNITY): Payer: Self-pay | Admitting: Internal Medicine

## 2022-12-13 DIAGNOSIS — Z1231 Encounter for screening mammogram for malignant neoplasm of breast: Secondary | ICD-10-CM

## 2022-12-30 DIAGNOSIS — E559 Vitamin D deficiency, unspecified: Secondary | ICD-10-CM | POA: Diagnosis not present

## 2022-12-30 DIAGNOSIS — E782 Mixed hyperlipidemia: Secondary | ICD-10-CM | POA: Diagnosis not present

## 2022-12-30 DIAGNOSIS — E039 Hypothyroidism, unspecified: Secondary | ICD-10-CM | POA: Diagnosis not present

## 2022-12-30 DIAGNOSIS — Z6826 Body mass index (BMI) 26.0-26.9, adult: Secondary | ICD-10-CM | POA: Diagnosis not present

## 2022-12-30 DIAGNOSIS — R7303 Prediabetes: Secondary | ICD-10-CM | POA: Diagnosis not present

## 2022-12-30 LAB — HEPATIC FUNCTION PANEL
ALT: 10 U/L (ref 7–35)
AST: 19 (ref 13–35)
Alkaline Phosphatase: 137 — AB (ref 25–125)
Bilirubin, Total: 0.3

## 2022-12-30 LAB — BASIC METABOLIC PANEL
BUN: 11 (ref 4–21)
CO2: 25 — AB (ref 13–22)
Chloride: 102 (ref 99–108)
Creatinine: 0.8 (ref 0.5–1.1)
Glucose: 104
Potassium: 4.9 meq/L (ref 3.5–5.1)
Sodium: 139 (ref 137–147)

## 2022-12-30 LAB — MICROALBUMIN / CREATININE URINE RATIO: Microalb Creat Ratio: 40

## 2022-12-30 LAB — COMPREHENSIVE METABOLIC PANEL
Albumin: 4.3 (ref 3.5–5.0)
Calcium: 9.6 (ref 8.7–10.7)
EGFR: 79
Globulin: 3

## 2022-12-30 LAB — PROTEIN / CREATININE RATIO, URINE
Albumin, U: 61.5
Creatinine, Urine: 153.6

## 2022-12-30 LAB — TSH: TSH: 0.28 — AB (ref 0.41–5.90)

## 2022-12-30 LAB — LIPID PANEL
Cholesterol: 181 (ref 0–200)
HDL: 58 (ref 35–70)
LDL Cholesterol: 112
LDl/HDL Ratio: 3.1
Triglycerides: 58 (ref 40–160)

## 2022-12-30 LAB — HEMOGLOBIN A1C: Hemoglobin A1C: 6.5

## 2023-01-04 ENCOUNTER — Other Ambulatory Visit (HOSPITAL_COMMUNITY): Payer: Self-pay | Admitting: Internal Medicine

## 2023-01-04 DIAGNOSIS — M5441 Lumbago with sciatica, right side: Secondary | ICD-10-CM | POA: Diagnosis not present

## 2023-01-04 DIAGNOSIS — E039 Hypothyroidism, unspecified: Secondary | ICD-10-CM | POA: Diagnosis not present

## 2023-01-04 DIAGNOSIS — K219 Gastro-esophageal reflux disease without esophagitis: Secondary | ICD-10-CM | POA: Diagnosis not present

## 2023-01-04 DIAGNOSIS — E782 Mixed hyperlipidemia: Secondary | ICD-10-CM | POA: Diagnosis not present

## 2023-01-04 DIAGNOSIS — M810 Age-related osteoporosis without current pathological fracture: Secondary | ICD-10-CM

## 2023-01-04 DIAGNOSIS — Z800 Family history of malignant neoplasm of digestive organs: Secondary | ICD-10-CM | POA: Diagnosis not present

## 2023-01-04 DIAGNOSIS — R809 Proteinuria, unspecified: Secondary | ICD-10-CM | POA: Diagnosis not present

## 2023-01-04 DIAGNOSIS — F411 Generalized anxiety disorder: Secondary | ICD-10-CM | POA: Diagnosis not present

## 2023-01-04 DIAGNOSIS — E1169 Type 2 diabetes mellitus with other specified complication: Secondary | ICD-10-CM | POA: Diagnosis not present

## 2023-01-06 ENCOUNTER — Encounter: Payer: Self-pay | Admitting: "Endocrinology

## 2023-01-13 DIAGNOSIS — Z6825 Body mass index (BMI) 25.0-25.9, adult: Secondary | ICD-10-CM | POA: Diagnosis not present

## 2023-01-13 DIAGNOSIS — R7303 Prediabetes: Secondary | ICD-10-CM | POA: Diagnosis not present

## 2023-01-31 ENCOUNTER — Ambulatory Visit (INDEPENDENT_AMBULATORY_CARE_PROVIDER_SITE_OTHER): Payer: 59 | Admitting: Gastroenterology

## 2023-02-03 ENCOUNTER — Ambulatory Visit (HOSPITAL_COMMUNITY)
Admission: RE | Admit: 2023-02-03 | Discharge: 2023-02-03 | Disposition: A | Discharge status: Home / Self Care | Payer: Medicare HMO | Source: Ambulatory Visit | Attending: Internal Medicine | Admitting: Internal Medicine

## 2023-02-03 DIAGNOSIS — Z1231 Encounter for screening mammogram for malignant neoplasm of breast: Secondary | ICD-10-CM | POA: Diagnosis present

## 2023-02-03 DIAGNOSIS — M810 Age-related osteoporosis without current pathological fracture: Secondary | ICD-10-CM

## 2023-02-07 ENCOUNTER — Other Ambulatory Visit (INDEPENDENT_AMBULATORY_CARE_PROVIDER_SITE_OTHER): Payer: Self-pay | Admitting: Gastroenterology

## 2023-02-07 DIAGNOSIS — K219 Gastro-esophageal reflux disease without esophagitis: Secondary | ICD-10-CM

## 2023-02-10 ENCOUNTER — Telehealth (INDEPENDENT_AMBULATORY_CARE_PROVIDER_SITE_OTHER): Payer: Self-pay | Admitting: *Deleted

## 2023-02-10 ENCOUNTER — Other Ambulatory Visit (INDEPENDENT_AMBULATORY_CARE_PROVIDER_SITE_OTHER): Payer: Self-pay | Admitting: *Deleted

## 2023-02-10 DIAGNOSIS — K219 Gastro-esophageal reflux disease without esophagitis: Secondary | ICD-10-CM

## 2023-02-10 MED ORDER — OMEPRAZOLE 20 MG PO CPDR: 20.0000 mg | DELAYED_RELEASE_CAPSULE | Freq: Every day | ORAL | 0 refills | Status: DC

## 2023-02-10 NOTE — Telephone Encounter (Signed)
error 

## 2023-02-10 NOTE — Telephone Encounter (Signed)
Elizabeth Lopez, I called pt and let her know refill was sent for omeprazole that she was requesting since she has appt scheduled 1/23. She said that date does not work and wanted something else.

## 2023-03-01 NOTE — Telephone Encounter (Signed)
error 

## 2023-03-24 ENCOUNTER — Ambulatory Visit (INDEPENDENT_AMBULATORY_CARE_PROVIDER_SITE_OTHER): Payer: Medicare HMO | Admitting: Gastroenterology

## 2023-03-27 ENCOUNTER — Other Ambulatory Visit: Payer: Self-pay | Admitting: "Endocrinology

## 2023-03-27 DIAGNOSIS — E782 Mixed hyperlipidemia: Secondary | ICD-10-CM

## 2023-04-14 ENCOUNTER — Encounter (INDEPENDENT_AMBULATORY_CARE_PROVIDER_SITE_OTHER): Payer: Self-pay | Admitting: Gastroenterology

## 2023-04-14 ENCOUNTER — Ambulatory Visit: Payer: Medicare HMO | Admitting: "Endocrinology

## 2023-04-14 ENCOUNTER — Ambulatory Visit (INDEPENDENT_AMBULATORY_CARE_PROVIDER_SITE_OTHER): Payer: Medicare HMO | Admitting: Gastroenterology

## 2023-04-14 VITALS — BP 158/75 | HR 76 | Temp 97.7°F | Ht 72.0 in | Wt 192.5 lb

## 2023-04-14 DIAGNOSIS — K219 Gastro-esophageal reflux disease without esophagitis: Secondary | ICD-10-CM | POA: Diagnosis not present

## 2023-04-14 MED ORDER — OMEPRAZOLE 20 MG PO CPDR
20.0000 mg | DELAYED_RELEASE_CAPSULE | Freq: Every day | ORAL | 3 refills | Status: AC
Start: 2023-04-14 — End: ?

## 2023-04-14 NOTE — Patient Instructions (Addendum)
Continue omeprazole 20 g every day Instruction provided in the use of antireflux medication - patient should take medication in the morning 30-45 minutes before eating breakfast. Discussed avoidance of eating within 2 hours of lying down to sleep and benefit of blocks to elevate head of bed. Also, will benefit from avoiding carbonated drinks/sodas or food that has tomatoes, spicy or greasy food.

## 2023-04-14 NOTE — Progress Notes (Signed)
Katrinka Blazing, M.D. Gastroenterology & Hepatology [facility] Gastroenterology [address] [place], [place] [address]  Primary Care Physician: Benita Stabile, MD [address] [place] [address]  I will communicate my assessment and recommendations to the referring MD via EMR.  Problems: GERD Family history colon cancer  History of Present Illness: Elizabeth Lopez is a 68 y.o. female with past medical history of anxiety, GERD, hyperlipidemia, hypothyroidism,  who presents for follow up of GERD.  The patient was last seen on 02/01/2022. At that time, the patient was continued on omeprazole 20 mg daily.  Education was provided regarding TIF procedure.  Patient reports that if she does not take her omeprazole, she will have some mild heartburn. Takes omeprazole 20 mg qday but ran out of her medication. No dysphagia or odynophagia.  The patient denies having any nausea, vomiting, fever, chills, hematochezia, melena, hematemesis, abdominal distention, abdominal pain, diarrhea, jaundice, pruritus or weight loss.  Last EGD: July 2015 revealing small sliding hiatal hernia. Mucosa of esophagus revealed some granularity but biopsies revealed no abnormality.    Last Colonoscopy: 12/2020 Diverticulosis in the distal sigmoid colon. - External hemorrhoids.   Recommended repeat in 5 years  Past Medical History: Past Medical History:  Diagnosis Date   Anxiety    Chronic constipation    Family hx of colon cancer    GERD (gastroesophageal reflux disease)    Hyperlipidemia    Hypothyroidism     Past Surgical History: Past Surgical History:  Procedure Laterality Date   COLONOSCOPY     COLONOSCOPY  02/12/2011   Procedure: COLONOSCOPY;  Surgeon: Malissa Hippo, MD;  [facility];  Service: Endoscopy;  Laterality: N/A;  9:45   COLONOSCOPY N/A 02/12/2016   Procedure: COLONOSCOPY;  Surgeon: Malissa Hippo, MD;  [facility];   Service: Endoscopy;  Laterality: N/A;  730   COLONOSCOPY WITH PROPOFOL N/A 01/14/2021   Procedure: COLONOSCOPY WITH PROPOFOL;  Surgeon: Malissa Hippo, MD;  [facility];  Service: Endoscopy;  Laterality: N/A;  8:30   ESOPHAGOGASTRODUODENOSCOPY N/A 10/05/2013   Procedure: ESOPHAGOGASTRODUODENOSCOPY (EGD);  Surgeon: Malissa Hippo, MD;  [facility];  Service: Endoscopy;  Laterality: N/A;  1200-rescheduled to 855 Ann notified pt   knot     patient had a knot removed from right wrisit 9 years ago.   LASIK     WRIST GANGLION EXCISION     right    Family History: Family History  Problem Relation Age of Onset   Colon cancer Mother    Cancer Mother    Healthy Sister    Healthy Brother    Healthy Sister    Healthy Sister    Healthy Brother    Healthy Brother    Cancer Father     Social History: Social History   Tobacco Use  Smoking Status Never  Smokeless Tobacco Never   Social History   Substance and Sexual Activity  Alcohol Use Yes   Comment: occasional wine   Social History   Substance and Sexual Activity  Drug Use No    Allergies: No Known Allergies  Medications: Current Outpatient Medications  Medication Sig Dispense Refill   ALPRAZolam (XANAX) 0.5 MG tablet Take 0.5 mg by mouth 2 (two) times daily as needed for anxiety.     Cholecalciferol (VITAMIN D PO) Take 1 tablet by mouth daily.     COD LIVER OIL PO Take 1 capsule  by mouth daily.     ELDERBERRY PO Take by mouth daily at 6 (six) AM.     levothyroxine (SYNTHROID) 150 MCG tablet TAKE 1 TABLET BY MOUTH DAILY BEFORE BREAKFAST. 90 tablet 1   omeprazole (PRILOSEC) 20 MG capsule Take 1 capsule (20 mg total) by mouth daily. . 90 capsule 0   rosuvastatin (CRESTOR) 20 MG tablet TAKE 1 TABLET BY MOUTH EVERY DAY 90 tablet 1   vitamin B-12 (CYANOCOBALAMIN) 500 MCG tablet Take 500 mcg by mouth daily.       No current facility-administered medications for this visit.    Review of  Systems: GENERAL: negative for malaise, night sweats HEENT: No changes in hearing or vision, no nose bleeds or other nasal problems. NECK: Negative for lumps, goiter, pain and significant neck swelling RESPIRATORY: Negative for cough, wheezing CARDIOVASCULAR: Negative for chest pain, leg swelling, palpitations, orthopnea GI: SEE HPI MUSCULOSKELETAL: Negative for joint pain or swelling, back pain, and muscle pain. SKIN: Negative for lesions, rash PSYCH: Negative for sleep disturbance, mood disorder and recent psychosocial stressors. HEMATOLOGY Negative for prolonged bleeding, bruising easily, and swollen nodes. ENDOCRINE: Negative for cold or heat intolerance, polyuria, polydipsia and goiter. NEURO: negative for tremor, gait imbalance, syncope and seizures. The remainder of the review of systems is noncontributory.   Physical Exam: LMP 07/30/2016 (Approximate)  GENERAL: The patient is AO x3, in no acute distress. HEENT: Head is normocephalic and atraumatic. EOMI are intact. Mouth is well hydrated and without lesions. NECK: Supple. No masses LUNGS: Clear to auscultation. No presence of rhonchi/wheezing/rales. Adequate chest expansion HEART: RRR, normal s1 and s2. ABDOMEN: Soft, nontender, no guarding, no peritoneal signs, and nondistended. BS +. No masses. EXTREMITIES: Without any cyanosis, clubbing, rash, lesions or edema. NEUROLOGIC: AOx3, no focal motor deficit. SKIN: no jaundice, no rashes  Imaging/Labs: as above  I personally reviewed and interpreted the available labs, imaging and endoscopic files.  Impression and Plan: Elizabeth Lopez is a 68 y.o. female with past medical history of anxiety, GERD, hyperlipidemia, hypothyroidism,  who presents for follow up of GERD.  Patient has presented adequate control of her GERD symptoms with low-dose PPI.  Denies any complaints.  Had some recurrence of symptoms when she skipped doses so she she will need to continue to continue taking  this on a regular basis.  -Continue omeprazole 20 g every day -Instruction provided in the use of antireflux medication - patient should take medication in the morning 30-45 minutes before eating breakfast. Discussed avoidance of eating within 2 hours of lying down to sleep and benefit of blocks to elevate head of bed. Also, will benefit from avoiding carbonated drinks/sodas or food that has tomatoes, spicy or greasy food.  All questions were answered.      Katrinka Blazing, MD Gastroenterology and Hepatology [facility] Gastroenterology

## 2023-04-15 LAB — LAB REPORT - SCANNED
A1c: 6.3
Albumin, Urine POC: 64.9
Creatinine, POC: 127.7 mg/dL
EGFR: 91
Free T4: 1.5 ng/dL
Microalb Creat Ratio: 51
TSH: 0.45 (ref 0.41–5.90)

## 2023-04-25 ENCOUNTER — Other Ambulatory Visit: Payer: Self-pay

## 2023-04-25 DIAGNOSIS — M810 Age-related osteoporosis without current pathological fracture: Secondary | ICD-10-CM | POA: Insufficient documentation

## 2023-04-28 ENCOUNTER — Telehealth: Payer: Self-pay

## 2023-04-28 NOTE — Telephone Encounter (Signed)
 Auth Submission: APPROVED Site of care: Site of care: [facility] Payer: [employer]   Medication & CPT/J Code(s) submitted: Prolia (Denosumab) [id number] Route of submission (phone, fax, portal): fax Phone # Fax # Auth type: Buy/Bill PB Units/visits requested: 60mg , q43months Reference number: [id number] Approval from: 04/26/23 to 02/29/24

## 2023-05-06 ENCOUNTER — Ambulatory Visit: Payer: Medicare HMO

## 2023-05-09 ENCOUNTER — Ambulatory Visit

## 2023-06-06 ENCOUNTER — Ambulatory Visit (INDEPENDENT_AMBULATORY_CARE_PROVIDER_SITE_OTHER): Payer: Medicare HMO | Admitting: "Endocrinology

## 2023-06-06 ENCOUNTER — Encounter: Payer: Self-pay | Admitting: "Endocrinology

## 2023-06-06 VITALS — BP 126/72 | HR 68 | Ht 72.0 in | Wt 195.4 lb

## 2023-06-06 DIAGNOSIS — R7303 Prediabetes: Secondary | ICD-10-CM | POA: Diagnosis not present

## 2023-06-06 DIAGNOSIS — E782 Mixed hyperlipidemia: Secondary | ICD-10-CM

## 2023-06-06 DIAGNOSIS — E890 Postprocedural hypothyroidism: Secondary | ICD-10-CM

## 2023-06-06 MED ORDER — LEVOTHYROXINE SODIUM 150 MCG PO TABS: 150.0000 ug | ORAL_TABLET | Freq: Every day | ORAL | 1 refills | Status: DC

## 2023-06-06 MED ORDER — ROSUVASTATIN CALCIUM 20 MG PO TABS
20.0000 mg | ORAL_TABLET | Freq: Every day | ORAL | 1 refills | Status: DC
Start: 2023-06-06 — End: 2023-12-07

## 2023-06-06 NOTE — Progress Notes (Signed)
 06/06/2023      Endocrinology follow-up note   Subjective:    Patient ID: Elizabeth Lopez, female    DOB: 1955/07/26, PCP Benita Stabile, MD   Past Medical History:  Diagnosis Date   Anxiety    Chronic constipation    Family hx of colon cancer    GERD (gastroesophageal reflux disease)    Hyperlipidemia    Hypothyroidism    Past Surgical History:  Procedure Laterality Date   COLONOSCOPY     COLONOSCOPY  02/12/2011   Procedure: COLONOSCOPY;  Surgeon: Malissa Hippo, MD;  [facility];  Service: Endoscopy;  Laterality: N/A;  9:45   COLONOSCOPY N/A 02/12/2016   Procedure: COLONOSCOPY;  Surgeon: Malissa Hippo, MD;  [facility];  Service: Endoscopy;  Laterality: N/A;  730   COLONOSCOPY WITH PROPOFOL N/A 01/14/2021   Procedure: COLONOSCOPY WITH PROPOFOL;  Surgeon: Malissa Hippo, MD;  [facility];  Service: Endoscopy;  Laterality: N/A;  8:30   ESOPHAGOGASTRODUODENOSCOPY N/A 10/05/2013   Procedure: ESOPHAGOGASTRODUODENOSCOPY (EGD);  Surgeon: Malissa Hippo, MD;  [facility];  Service: Endoscopy;  Laterality: N/A;  1200-rescheduled to 855 Ann notified pt   knot     patient had a knot removed from right wrisit 9 years ago.   LASIK     WRIST GANGLION EXCISION     right   Social History   Socioeconomic History   Marital status: Married    Spouse name: Not on file   Number of children: Not on file   Years of education: Not on file   Highest education level: Not on file  Occupational History   Not on file  Tobacco Use   Smoking status: Never   Smokeless tobacco: Never  Vaping Use   Vaping status: Never Used  Substance and Sexual Activity   Alcohol use: Yes    Comment: occasional wine   Drug use: No   Sexual activity: Not on file  Other Topics Concern   Not on file  Social History Narrative   Not on file   Social Drivers of Health   Financial Resource Strain: Not on file  Food Insecurity: Not on file  Transportation  Needs: Not on file  Physical Activity: Sufficiently Active (03/23/2021)   Received from [facility], [facility]   Exercise Vital Sign    Days of Exercise per Week: 3 days    Minutes of Exercise per Session: 60 min  Stress: No Stress Concern Present (03/23/2021)   Received from [facility], [facility] of Occupational Health - Occupational Stress Questionnaire    Feeling of Stress : Not at all  Social Connections: Unknown (07/10/2021)   Received from [facility], [facility]   Social Network    Social Network: Not on file   Outpatient Encounter Medications as of 06/06/2023  Medication Sig   ALPRAZolam (XANAX) 0.5 MG tablet Take 0.5 mg by mouth 2 (two) times daily as needed for anxiety.   Cholecalciferol (VITAMIN D PO) Take 1 tablet by mouth daily.   COD LIVER OIL PO Take 1 capsule by mouth daily.   levothyroxine (SYNTHROID) 150 MCG tablet Take 1 tablet (150 mcg total) by mouth daily before breakfast.   omeprazole (PRILOSEC) 20 MG capsule Take 1 capsule (20 mg total) by mouth daily. .   rosuvastatin (CRESTOR) 20 MG tablet Take 1 tablet (20 mg total) by mouth daily.  vitamin B-12 (CYANOCOBALAMIN) 500 MCG tablet Take 500 mcg by mouth daily.     [DISCONTINUED] levothyroxine (SYNTHROID) 150 MCG tablet TAKE 1 TABLET BY MOUTH DAILY BEFORE BREAKFAST.   [DISCONTINUED] rosuvastatin (CRESTOR) 20 MG tablet TAKE 1 TABLET BY MOUTH EVERY DAY   No facility-administered encounter medications on file as of 06/06/2023.   ALLERGIES: No Known Allergies VACCINATION STATUS: Immunization History  Administered Date(s) Administered   Influenza-Unspecified 12/01/2015   Moderna Sars-Covid-2 Vaccination 04/23/2019, 05/22/2019, 01/03/2020    HPI  68 yo female with with Graves disease status po I131 therapy on Aril 2012. She remains on Synthroid 150 mcg p.o. daily before breakfast with good consistency and compliance.    She presents with progressive loss of 10 pounds of  weight overall.  her previsit labs are consistent with appropriate replacement.  Her previsit labs show steady LDL at 112, remains on Crestor 20 mg p.o. nightly.  -She denies palpitations, tremors, heat intolerance.   Review of Systems  Limited as above.  Objective:    BP 126/72   Pulse 68   Ht 6' (1.829 m)   Wt 195 lb 6.4 oz (88.6 kg)   LMP 07/30/2016 (Approximate)   BMI 26.50 kg/m   Wt Readings from Last 3 Encounters:  06/06/23 195 lb 6.4 oz (88.6 kg)  04/14/23 192 lb 8 oz (87.3 kg)  10/12/22 199 lb 6.4 oz (90.4 kg)    Physical Exam   Results for orders placed or performed in visit on 04/22/23  Lab report - scanned   Collection Time: 04/15/23 11:01 AM  Result Value Ref Range   A1c 6.3    Creatinine, POC 127.7 mg/dL   Albumin, Urine POC 16.1    Microalb Creat Ratio 51.0    EGFR 91.0    TSH 0.45 0.41 - 5.90   Free T4 1.50 ng/dL   Complete Blood Count (Most recent): Lab Results  Component Value Date   WBC 7.1 11/10/2018   HGB 11.8 (A) 06/16/2010   HCT 42 (A) 11/10/2018   MCV 90 11/10/2018   PLT 279 06/16/2010   Chemistry (most recent): Lab Results  Component Value Date   NA 139 12/30/2022   K 4.9 12/30/2022   CL 102 12/30/2022   CO2 25 (A) 12/30/2022   BUN 11 12/30/2022   CREATININE 0.8 12/30/2022     Assessment & Plan:   RAI induced hypothyroidism, hyperlipidemia, prediabetes  Hypothyroidism due to RAI   - Her visit thyroid function tests are consistent with appropriate replacement.  She is advised to continue  Synthroid  150 mcg p.o. daily before breakfast.    - We discussed about the correct intake of her thyroid hormone, on empty stomach at fasting, with water, separated by at least 30 minutes from breakfast and other medications,  and separated by more than 4 hours from calcium, iron, multivitamins, acid reflux medications (PPIs). -Patient is made aware of the fact that thyroid hormone replacement is needed for life, dose to be adjusted by periodic  monitoring of thyroid function tests.   Hyperlipidemia: She presents with stable lipid panel with LDL at 112.  She promises to change her diet to achieve better control and does not want to escalate the dose of her Crestor.  She is advised to continue Crestor 20 mg p.o. nightly.  Side effects and precautions discussed with her.     Considering her metabolic dysfunction involving prediabetes and severe dyslipidemia, she remains a good candidate for lifestyle medicine.   - she  acknowledges that there is a room for improvement in her food and drink choices. - Suggestion is made for her to avoid simple carbohydrates  from her diet including Cakes, Sweet Desserts, Ice Cream, Soda (diet and regular), Sweet Tea, Candies, Chips, Cookies, Store Bought Juices, Alcohol , Artificial Sweeteners,  Coffee Creamer, and "Sugar-free" Products, Lemonade. This will help patient to have more stable blood glucose profile and potentially avoid unintended weight gain.  The following Lifestyle Medicine recommendations according to American College of Lifestyle Medicine  [facility]) were discussed and and offered to patient and she  agrees to start the journey:  A. Whole Foods, Plant-Based Nutrition comprising of fruits and vegetables, plant-based proteins, whole-grain carbohydrates was discussed in detail with the patient.   A list for source of those nutrients were also provided to the patient.  Patient will use only water or unsweetened tea for hydration. B.  The need to stay away from risky substances including alcohol, smoking; obtaining 7 to 9 hours of restorative sleep, at least 150 minutes of moderate intensity exercise weekly, the importance of healthy social connections,  and stress management techniques were discussed. C.  A full color page of  Calorie density of various food groups per pound showing examples of each food groups was provided to the patient.  Her point-of-care A1c today remains at 6.3%.  She will not  need medication intervention at this time.   - I advised patient to maintain close follow up with Benita Stabile, MD for primary care needs.                                                 She is advised to maintain close follow-up with her PCP.                       I spent  26  minutes in the care of the patient today including review of labs from Thyroid Function, CMP, and other relevant labs ; imaging/biopsy records (current and previous including abstractions from other facilities); face-to-face time discussing  her lab results and symptoms, medications doses, her options of short and long term treatment based on the latest standards of care / guidelines;   and documenting the encounter.  Elizabeth Lopez  participated in the discussions, expressed understanding, and voiced agreement with the above plans.  All questions were answered to her satisfaction. she is encouraged to contact clinic should she have any questions or concerns prior to her return visit.   Follow up plan: Return in about 6 months (around 12/06/2023) for F/U with Pre-visit Labs.  Marquis Lunch, MD Phone: [phone]  Fax: [phone]  -  This note was partially dictated with voice recognition software. Similar sounding words can be transcribed inadequately or may not  be corrected upon review.  06/06/2023, 4:58 PM

## 2023-06-16 ENCOUNTER — Ambulatory Visit

## 2023-06-20 ENCOUNTER — Ambulatory Visit

## 2023-09-30 LAB — LAB REPORT - SCANNED
A1c: 6.2
Albumin, Urine POC: 10.2
Creatinine, POC: 94.6 mg/dL
EGFR: 95
Microalb Creat Ratio: 11

## 2023-10-06 ENCOUNTER — Encounter: Payer: Self-pay | Admitting: Internal Medicine

## 2023-12-01 LAB — LIPID PANEL
Chol/HDL Ratio: 3.1 ratio (ref 0.0–4.4)
Cholesterol, Total: 189 mg/dL (ref 100–199)
HDL: 61 mg/dL (ref >39)
LDL Chol Calc (NIH): 119 mg/dL — ABNORMAL HIGH (ref 0–99)
Triglycerides: 49 mg/dL (ref 0–149)
VLDL Cholesterol Cal: 9 mg/dL (ref 5–40)

## 2023-12-01 LAB — TSH: TSH: 0.743 u[IU]/mL (ref 0.450–4.500)

## 2023-12-01 LAB — T4, FREE: Free T4: 1.43 ng/dL (ref 0.82–1.77)

## 2023-12-07 ENCOUNTER — Encounter: Payer: Self-pay | Admitting: "Endocrinology

## 2023-12-07 ENCOUNTER — Ambulatory Visit: Admitting: "Endocrinology

## 2023-12-07 VITALS — BP 122/76 | HR 72 | Ht 72.0 in | Wt 198.2 lb

## 2023-12-07 DIAGNOSIS — E890 Postprocedural hypothyroidism: Secondary | ICD-10-CM

## 2023-12-07 DIAGNOSIS — E782 Mixed hyperlipidemia: Secondary | ICD-10-CM | POA: Diagnosis not present

## 2023-12-07 DIAGNOSIS — R7303 Prediabetes: Secondary | ICD-10-CM | POA: Diagnosis not present

## 2023-12-07 MED ORDER — ROSUVASTATIN CALCIUM 40 MG PO TABS: 40.0000 mg | ORAL_TABLET | Freq: Every day | ORAL | 1 refills | Status: AC

## 2023-12-07 NOTE — Progress Notes (Signed)
 12/07/2023      Endocrinology follow-up note   Subjective:    Patient ID: Elizabeth Lopez, female    DOB: 09/25/1955, PCP Shona Norleen PEDLAR, MD   Past Medical History:  Diagnosis Date   Anxiety    Chronic constipation    Family hx of colon cancer    GERD (gastroesophageal reflux disease)    Hyperlipidemia    Hypothyroidism    Past Surgical History:  Procedure Laterality Date   COLONOSCOPY     COLONOSCOPY  02/12/2011   Procedure: COLONOSCOPY;  Surgeon: Claudis RAYMOND Rivet, MD;  [facility];  Service: Endoscopy;  Laterality: N/A;  9:45   COLONOSCOPY N/A 02/12/2016   Procedure: COLONOSCOPY;  Surgeon: Claudis RAYMOND Rivet, MD;  [facility];  Service: Endoscopy;  Laterality: N/A;  730   COLONOSCOPY WITH PROPOFOL  N/A 01/14/2021   Procedure: COLONOSCOPY WITH PROPOFOL ;  Surgeon: Rivet Claudis RAYMOND, MD;  [facility];  Service: Endoscopy;  Laterality: N/A;  8:30   ESOPHAGOGASTRODUODENOSCOPY N/A 10/05/2013   Procedure: ESOPHAGOGASTRODUODENOSCOPY (EGD);  Surgeon: Claudis RAYMOND Rivet, MD;  [facility];  Service: Endoscopy;  Laterality: N/A;  1200-rescheduled to 855 Ann notified pt   knot     patient had a knot removed from right wrisit 9 years ago.   LASIK     WRIST GANGLION EXCISION     right   Social History   Socioeconomic History   Marital status: Married    Spouse name: Not on file   Number of children: Not on file   Years of education: Not on file   Highest education level: Not on file  Occupational History   Not on file  Tobacco Use   Smoking status: Never   Smokeless tobacco: Never  Vaping Use   Vaping status: Never Used  Substance and Sexual Activity   Alcohol use: Yes    Comment: occasional wine   Drug use: No   Sexual activity: Not on file  Other Topics Concern   Not on file  Social History Narrative   Not on file   Social Drivers of Health   Financial Resource Strain: Not on file  Food Insecurity: Not on file  Transportation  Needs: Not on file  Physical Activity: Sufficiently Active (03/23/2021)   Received from [facility]   Exercise Vital Sign    On average, how many days per week do you engage in moderate to strenuous exercise (like a brisk walk)?: 3 days    On average, how many minutes do you engage in exercise at this level?: 60 min  Stress: No Stress Concern Present (03/23/2021)   Received from [facility] of Occupational Health - Occupational Stress Questionnaire    Feeling of Stress : Not at all  Social Connections: Unknown (07/10/2021)   Received from [facility]   Social Network    Social Network: Not on file   Outpatient Encounter Medications as of 12/07/2023  Medication Sig   ALPRAZolam (XANAX) 0.5 MG tablet Take 0.5 mg by mouth 2 (two) times daily as needed for anxiety.   Cholecalciferol (VITAMIN D  PO) Take 1 tablet by mouth daily.   COD LIVER OIL PO Take 1 capsule by mouth daily.   levothyroxine  (SYNTHROID ) 150 MCG tablet Take 1 tablet (150 mcg total) by mouth daily before breakfast.   metFORMIN (GLUCOPHAGE) 500 MG tablet Take 500 mg by mouth daily with breakfast.   omeprazole  (PRILOSEC) 20 MG  capsule Take 1 capsule (20 mg total) by mouth daily. .   rosuvastatin  (CRESTOR ) 40 MG tablet Take 1 tablet (40 mg total) by mouth at bedtime.   vitamin B-12 (CYANOCOBALAMIN) 500 MCG tablet Take 500 mcg by mouth daily.     [DISCONTINUED] rosuvastatin  (CRESTOR ) 20 MG tablet Take 1 tablet (20 mg total) by mouth daily.   No facility-administered encounter medications on file as of 12/07/2023.   ALLERGIES: No Known Allergies VACCINATION STATUS: Immunization History  Administered Date(s) Administered   Influenza-Unspecified 12/01/2015   Moderna Sars-Covid-2 Vaccination 04/23/2019, 05/22/2019, 01/03/2020    HPI  68 yo female with with Graves disease status po I131 therapy on Aril 2012. She remains on Synthroid  150 mcg p.o. daily before breakfast with good consistency and  compliance.    She presents with progressive loss of 10 pounds of weight overall.  In the interval, she was started on metformin for prediabetes. her previsit labs are consistent with appropriate replacement.  Her previsit labs show persistent dyslipidemia with LDL at 119, despite Crestor  20 mg p.o. nightly.    -She denies palpitations, tremors, heat intolerance.   Review of Systems  Limited as above.  Objective:    BP 122/76   Pulse 72   Ht 6' (1.829 m)   Wt 198 lb 3.2 oz (89.9 kg)   LMP 07/30/2016 (Approximate)   BMI 26.88 kg/m   Wt Readings from Last 3 Encounters:  12/07/23 198 lb 3.2 oz (89.9 kg)  06/06/23 195 lb 6.4 oz (88.6 kg)  04/14/23 192 lb 8 oz (87.3 kg)    Physical Exam   Results for orders placed or performed in visit on 10/06/23  Lab report - scanned   Collection Time: 09/30/23 10:29 AM  Result Value Ref Range   A1c 6.2    Creatinine, POC 94.6 mg/dL   Albumin, Urine POC 89.7    Microalb Creat Ratio 11    EGFR 95.0    Complete Blood Count (Most recent): Lab Results  Component Value Date   WBC 7.1 11/10/2018   HGB 11.8 (A) 06/16/2010   HCT 42 (A) 11/10/2018   MCV 90 11/10/2018   PLT 279 06/16/2010   Chemistry (most recent): Lab Results  Component Value Date   NA 139 12/30/2022   K 4.9 12/30/2022   CL 102 12/30/2022   CO2 25 (A) 12/30/2022   BUN 11 12/30/2022   CREATININE 0.8 12/30/2022     Assessment & Plan:   RAI induced hypothyroidism, hyperlipidemia, prediabetes  - Her visit thyroid  function tests are consistent with appropriate replacement.  She is advised to continue Synthroid  150 mcg p.o. daily before breakfast.    - We discussed about the correct intake of her thyroid  hormone, on empty stomach at fasting, with water , separated by at least 30 minutes from breakfast and other medications,  and separated by more than 4 hours from calcium , iron, multivitamins, acid reflux medications (PPIs). -Patient is made aware of the fact that  thyroid  hormone replacement is needed for life, dose to be adjusted by periodic monitoring of thyroid  function tests.  Hyperlipidemia: She presents with stable lipid panel with LDL at 119.  She will benefit from a higher dose of Crestor , discussed and increase Crestor  to 40 mg p.o. nightly.   Side effects and precautions discussed with her.     Considering her metabolic dysfunction involving prediabetes and severe dyslipidemia, she remains a good candidate for lifestyle medicine.   - she acknowledges that there is a room  for improvement in her food and drink choices. - Suggestion is made for her to avoid simple carbohydrates  from her diet including Cakes, Sweet Desserts, Ice Cream, Soda (diet and regular), Sweet Tea, Candies, Chips, Cookies, Store Bought Juices, Alcohol , Artificial Sweeteners,  Coffee Creamer, and Sugar-free Products, Lemonade. This will help patient to have more stable blood glucose profile and potentially avoid unintended weight gain.    Her point-of-care A1c today remains at 6.2%.  I agree with her to stay on metformin to delay progression to type 2 diabetes.     - I advised patient to maintain close follow up with Shona Norleen PEDLAR, MD for primary care needs.                                                 She is advised to maintain close follow-up with her PCP.                       I spent  25  minutes in the care of the patient today including review of labs from Thyroid  Function, CMP, and other relevant labs ; imaging/biopsy records (current and previous including abstractions from other facilities); face-to-face time discussing  her lab results and symptoms, medications doses, her options of short and long term treatment based on the latest standards of care / guidelines;   and documenting the encounter.  Elizabeth Lopez  participated in the discussions, expressed understanding, and voiced agreement with the above plans.  All questions were answered to her  satisfaction. she is encouraged to contact clinic should she have any questions or concerns prior to her return visit.   Follow up plan: Return in about 6 months (around 06/06/2024) for Fasting Labs  in AM B4 8.  Ranny Earl, MD Phone: [phone]  Fax: [phone]  -  This note was partially dictated with voice recognition software. Similar sounding words can be transcribed inadequately or may not  be corrected upon review.  12/07/2023, 6:18 PM

## 2023-12-14 ENCOUNTER — Encounter (INDEPENDENT_AMBULATORY_CARE_PROVIDER_SITE_OTHER): Payer: Self-pay | Admitting: Gastroenterology

## 2023-12-30 ENCOUNTER — Telehealth: Payer: Self-pay

## 2023-12-30 NOTE — Telephone Encounter (Signed)
 Auth Submission: APPROVED Site of care: Site of care: [facility] Payer: [employer] Medication & CPT/J Code(s) submitted: Prolia (Denosumab) [id number] Diagnosis Code:  Route of submission (phone, fax, portal): portal Phone # Fax # Auth type: Buy/Bill PB Units/visits requested: 60mg ,  [id number] Reference number: [id number] Approval from: 03/02/23 to 02/28/25

## 2024-01-03 ENCOUNTER — Other Ambulatory Visit (HOSPITAL_COMMUNITY): Payer: Self-pay | Admitting: Internal Medicine

## 2024-01-03 DIAGNOSIS — Z1231 Encounter for screening mammogram for malignant neoplasm of breast: Secondary | ICD-10-CM

## 2024-02-06 ENCOUNTER — Ambulatory Visit (HOSPITAL_COMMUNITY)

## 2024-02-10 LAB — LAB REPORT - SCANNED
A1c: 6.2
Albumin, Urine POC: 6.2
Creatinine, POC: 61.8 mg/dL
EGFR: 92
Microalb Creat Ratio: 10

## 2024-02-20 ENCOUNTER — Inpatient Hospital Stay (HOSPITAL_COMMUNITY): Admission: RE | Admit: 2024-02-20 | Source: Ambulatory Visit

## 2024-02-27 ENCOUNTER — Ambulatory Visit (HOSPITAL_COMMUNITY)

## 2024-03-02 ENCOUNTER — Inpatient Hospital Stay (HOSPITAL_COMMUNITY): Admission: RE | Admit: 2024-03-02 | Source: Ambulatory Visit

## 2024-03-07 ENCOUNTER — Ambulatory Visit (HOSPITAL_COMMUNITY)
Admission: RE | Admit: 2024-03-07 | Discharge: 2024-03-07 | Disposition: A | Discharge status: Home / Self Care | Source: Ambulatory Visit | Attending: Internal Medicine | Admitting: Internal Medicine

## 2024-03-07 DIAGNOSIS — Z1231 Encounter for screening mammogram for malignant neoplasm of breast: Secondary | ICD-10-CM | POA: Insufficient documentation

## 2024-03-31 ENCOUNTER — Other Ambulatory Visit: Payer: Self-pay | Admitting: "Endocrinology

## 2024-06-13 ENCOUNTER — Ambulatory Visit: Admitting: "Endocrinology
